# Patient Record
Sex: Male | Born: 1985 | Race: Black or African American | Hispanic: No | Marital: Married | Smoking: Current every day smoker
Health system: Southern US, Community
[De-identification: ages and names within clinical notes are randomized; demographics above are authoritative.]

## PROBLEM LIST (undated history)

## (undated) DIAGNOSIS — F200 Paranoid schizophrenia: Secondary | ICD-10-CM

## (undated) HISTORY — PX: APPENDECTOMY: SHX54

---

## 2019-03-07 ENCOUNTER — Encounter (HOSPITAL_BASED_OUTPATIENT_CLINIC_OR_DEPARTMENT_OTHER): Payer: Self-pay | Admitting: *Deleted

## 2019-03-07 ENCOUNTER — Emergency Department (HOSPITAL_BASED_OUTPATIENT_CLINIC_OR_DEPARTMENT_OTHER)
Admission: EM | Admit: 2019-03-07 | Discharge: 2019-03-07 | Disposition: A | Discharge status: Home / Self Care | Payer: Medicaid Other | Attending: Emergency Medicine | Admitting: Emergency Medicine

## 2019-03-07 ENCOUNTER — Other Ambulatory Visit: Payer: Self-pay

## 2019-03-07 ENCOUNTER — Emergency Department (HOSPITAL_BASED_OUTPATIENT_CLINIC_OR_DEPARTMENT_OTHER): Payer: Medicaid Other

## 2019-03-07 DIAGNOSIS — W298XXA Contact with other powered powered hand tools and household machinery, initial encounter: Secondary | ICD-10-CM | POA: Insufficient documentation

## 2019-03-07 DIAGNOSIS — S60921A Unspecified superficial injury of right hand, initial encounter: Secondary | ICD-10-CM | POA: Diagnosis present

## 2019-03-07 DIAGNOSIS — Y929 Unspecified place or not applicable: Secondary | ICD-10-CM | POA: Insufficient documentation

## 2019-03-07 DIAGNOSIS — F17210 Nicotine dependence, cigarettes, uncomplicated: Secondary | ICD-10-CM | POA: Insufficient documentation

## 2019-03-07 DIAGNOSIS — Y939 Activity, unspecified: Secondary | ICD-10-CM | POA: Diagnosis not present

## 2019-03-07 DIAGNOSIS — Y999 Unspecified external cause status: Secondary | ICD-10-CM | POA: Diagnosis not present

## 2019-03-07 DIAGNOSIS — S61431A Puncture wound without foreign body of right hand, initial encounter: Secondary | ICD-10-CM | POA: Insufficient documentation

## 2019-03-07 MED ORDER — CEPHALEXIN 500 MG PO CAPS: 500.0000 mg | ORAL_CAPSULE | Freq: Three times a day (TID) | ORAL | 0 refills | Status: DC

## 2019-03-07 NOTE — ED Triage Notes (Signed)
c/o lac to right hand lac by metal x 2 days ago

## 2019-03-07 NOTE — ED Provider Notes (Signed)
[facility] EMERGENCY DEPARTMENT Provider Note   CSN: [id number] Arrival date & time: 03/07/19  1524    History   Chief Complaint Chief Complaint  Patient presents with  . Hand Injury    HPI Joe Barrett is a 33 y.o. male.     Patient presents the emergency department with complaint of right hand wound sustained 2 days ago.  Patient was working with a drill bit used to drill wood.  This impacted his right palm towards the mid palm on the ulnar aspect.  Since that time he has had continued pain that is worse with movement.  He is able to flex his fingers.  No redness.  He reports mild swelling.  No difficulty with movement of the wrist or elbow.  No numbness or tingling.  Tetanus up-to-date in the past 2 years.  He is right-handed.     History reviewed. No pertinent past medical history.  There are no active problems to display for this patient.   Past Surgical History:  Procedure Laterality Date  . APPENDECTOMY          Home Medications    Prior to Admission medications   Not on File    Family History History reviewed. No pertinent family history.  Social History Social History   Tobacco Use  . Smoking status: Current Every Day Smoker    Packs/day: 0.50    Types: Cigarettes  . Smokeless tobacco: Never Used  Substance Use Topics  . Alcohol use: Yes    Comment: occ  . Drug use: Not Currently     Allergies   Patient has no known allergies.   Review of Systems Review of Systems  Constitutional: Negative for activity change.  Musculoskeletal: Positive for arthralgias. Negative for neck pain.  Skin: Positive for wound.  Neurological: Negative for weakness and numbness.     Physical Exam Updated Vital Signs BP 111/79   Pulse 90   Temp 98.4 F (36.9 C)   Resp 16   Ht 5\' 3"  (1.6 m)   Wt 97.5 kg   SpO2 99%   BMI 38.09 kg/m   Physical Exam Vitals signs and nursing note reviewed.  Constitutional:      Appearance: He is  well-developed.  HENT:     Head: Normocephalic and atraumatic.  Eyes:     Conjunctiva/sclera: Conjunctivae normal.  Neck:     Musculoskeletal: Normal range of motion and neck supple.  Cardiovascular:     Pulses: Normal pulses. No decreased pulses.  Musculoskeletal:        General: Tenderness present.     Comments: Patient with full active flexion and extension of his right fingers including the ring and short fingers.  There is a healing puncture noted to the mid palm between where the flexor tendons run to the ring and short digits.  No active swelling.  No pain with passive extension. No Kanavel signs.  Capillary refills less than 2 seconds in all fingers.  Skin:    General: Skin is warm and dry.  Neurological:     Mental Status: He is alert.     Sensory: No sensory deficit.     Comments: Motor, sensation, and vascular distal to the injury is fully intact.       ED Treatments / Results  Labs (all labs ordered are listed, but only abnormal results are displayed) Labs Reviewed - No data to display  EKG None  Radiology Dg Hand Complete Right  Result Date: 03/07/2019  CLINICAL DATA:  Pain following puncture wound EXAM: RIGHT HAND - COMPLETE 3+ VIEW COMPARISON:  None. FINDINGS: Frontal, oblique, and lateral views obtained. No fracture or dislocation. Joint spaces appear normal. No erosive change. No soft tissue air or radiopaque foreign body evident. IMPRESSION: No fracture or dislocation. No evident arthropathy. No radiopaque foreign body or soft tissue air evident. Electronically Signed   By: Bretta BangWilliam  Woodruff III M.D.   On: 03/07/2019 16:18    Procedures Procedures (including critical care time)  Medications Ordered in ED Medications - No data to display   Initial Impression / Assessment and Plan / ED Course  I have reviewed the triage vital signs and the nursing notes.  Pertinent labs & imaging results that were available during my care of the patient were reviewed by me  and considered in my medical decision making (see chart for details).        Patient seen and examined.  X-ray ordered to evaluate for bony injury or signs of foreign body.  Wound is currently closed.  No signs of active cellulitis or flexor tenosynovitis, however given nature of wound I feel it would be prudent to start patient on prophylactic antibiotics.  Tetanus is reportedly up-to-date.  X-ray pending.  Vital signs reviewed and are as follows: BP 111/79   Pulse 90   Temp 98.4 F (36.9 C)   Resp 16   Ht 5\' 3"  (1.6 m)   Wt 97.5 kg   SpO2 99%   BMI 38.09 kg/m   No concerning findings on x-ray.  Patient counseled on wound care and signs and symptoms to return including signs of flexor tenosynovitis.  Keflex x5 days prescribed.  Patient verbalizes understanding agrees with plan.  Final Clinical Impressions(s) / ED Diagnoses   Final diagnoses:  Puncture wound of right hand without foreign body, initial encounter   Puncture wound to hand.  No evidence of flexor tenosynovitis or flexor tendon disruption on exam today.  Imaging is reassuring.  Tetanus is up-to-date.  No indication for further work-up at this time.  Patient counseled on signs and symptoms to return as above.  ED Discharge Orders         Ordered    cephALEXin (KEFLEX) 500 MG capsule  3 times daily     03/07/19 1629           Renne CriglerGeiple, Darrik Richman, PA-C 03/07/19 1645    Vanetta MuldersZackowski, Scott, MD 03/09/19 [phone]

## 2019-03-07 NOTE — ED Notes (Signed)
Pt verbalized understanding of dc instructions.

## 2019-03-07 NOTE — Discharge Instructions (Signed)
Please read and follow all provided instructions.  Your diagnoses today include:  1. Puncture wound of right hand without foreign body, initial encounter     Tests performed today include:  An x-ray of the affected area - does NOT show any broken bones  Vital signs. See below for your results today.   Medications prescribed:   Keflex (cephalexin) - antibiotic  You have been prescribed an antibiotic medicine: take the entire course of medicine even if you are feeling better. Stopping early can cause the antibiotic not to work.  Take any prescribed medications only as directed.  Home care instructions:   Follow any educational materials contained in this packet  Follow R.I.C.E. Protocol:  R - rest your injury   I  - use ice on injury without applying directly to skin  C - compress injury with bandage or splint  E - elevate the injury as much as possible  Follow-up instructions: Please follow-up with your primary care provider if you continue to have significant pain in 1 week. In this case you may have a more severe injury that requires further care.   Return instructions:   Please return if your fingers are numb or tingling, appear gray or blue, or you have severe pain (also elevate the arm and loosen splint or wrap if you were given one)  Return if you have severe pain if you try to straighten your finger out or worsening redness and swelling around the puncture wound  Please return to the Emergency Department if you experience worsening symptoms.   Please return if you have any other emergent concerns.  Additional Information:  Your vital signs today were: BP 111/79    Pulse 90    Temp 98.4 F (36.9 C)    Resp 16    Ht 5\' 3"  (1.6 m)    Wt 97.5 kg    SpO2 99%    BMI 38.09 kg/m  If your blood pressure (BP) was elevated above 135/85 this visit, please have this repeated by your doctor within one month. --------------

## 2019-03-11 ENCOUNTER — Other Ambulatory Visit: Payer: Self-pay

## 2019-03-11 ENCOUNTER — Encounter (HOSPITAL_BASED_OUTPATIENT_CLINIC_OR_DEPARTMENT_OTHER): Payer: Self-pay | Admitting: Adult Health

## 2019-03-11 ENCOUNTER — Emergency Department (HOSPITAL_BASED_OUTPATIENT_CLINIC_OR_DEPARTMENT_OTHER)
Admission: EM | Admit: 2019-03-11 | Discharge: 2019-03-11 | Disposition: A | Discharge status: Home / Self Care | Payer: Medicaid Other | Attending: Emergency Medicine | Admitting: Emergency Medicine

## 2019-03-11 DIAGNOSIS — R112 Nausea with vomiting, unspecified: Secondary | ICD-10-CM | POA: Insufficient documentation

## 2019-03-11 DIAGNOSIS — R1111 Vomiting without nausea: Secondary | ICD-10-CM

## 2019-03-11 DIAGNOSIS — F17210 Nicotine dependence, cigarettes, uncomplicated: Secondary | ICD-10-CM | POA: Insufficient documentation

## 2019-03-11 DIAGNOSIS — R1110 Vomiting, unspecified: Secondary | ICD-10-CM | POA: Diagnosis present

## 2019-03-11 MED ORDER — ONDANSETRON 4 MG PO TBDP
4.0000 mg | ORAL_TABLET | Freq: Once | ORAL | Status: AC
  Administered 2019-03-11: 4 mg via ORAL
  Filled 2019-03-11: qty 1

## 2019-03-11 NOTE — ED Triage Notes (Signed)
N/V for past 24 hours, he denies pain, denies fevers. LAst emesis this AM. He is sipping tea.

## 2019-03-11 NOTE — ED Notes (Signed)
Pt sipping tea, denies nausea at this time

## 2019-03-11 NOTE — ED Provider Notes (Signed)
[facility] EMERGENCY DEPARTMENT Provider Note   CSN: [id number] Arrival date & time: 03/11/19  1249    History   Chief Complaint Chief Complaint  Patient presents with  . Emesis    HPI Joe Barrett is a 33 y.o. male.     33 year old male with history of schizophrenia who presents with vomiting.  Patient states that he began having vomiting last night, last episode of vomiting was this morning.  He reports some mild constipation but denies any diarrhea.  No associated fever, abdominal pain, cough, sore throat, urinary symptoms, sick contacts, or recent travel.  No therapies tried for his symptoms.  No recent medication changes.  The history is provided by the patient.  Emesis    History reviewed. No pertinent past medical history.  There are no active problems to display for this patient.   Past Surgical History:  Procedure Laterality Date  . APPENDECTOMY          Home Medications    Prior to Admission medications   Medication Sig Start Date End Date Taking? Authorizing Provider  cephALEXin (KEFLEX) 500 MG capsule Take 1 capsule (500 mg total) by mouth 3 (three) times daily. 03/07/19   Renne CriglerGeiple, Joshua, PA-C    Family History History reviewed. No pertinent family history.  Social History Social History   Tobacco Use  . Smoking status: Current Every Day Smoker    Packs/day: 0.50    Types: Cigarettes  . Smokeless tobacco: Never Used  Substance Use Topics  . Alcohol use: Yes    Comment: occ  . Drug use: Not Currently     Allergies   Patient has no known allergies.   Review of Systems Review of Systems  Gastrointestinal: Positive for vomiting.   All other systems reviewed and are negative except that which was mentioned in HPI   Physical Exam Updated Vital Signs BP 131/85 (BP Location: Right Arm)   Pulse 81   Temp 98.1 F (36.7 C) (Oral)   Resp 14   Ht 5\' 3"  (1.6 m)   Wt 98 kg   SpO2 100%   BMI 38.26 kg/m   Physical  Exam Vitals signs and nursing note reviewed.  Constitutional:      General: He is not in acute distress.    Appearance: He is well-developed.  HENT:     Head: Normocephalic and atraumatic.     Mouth/Throat:     Mouth: Mucous membranes are moist.     Pharynx: Oropharynx is clear.  Eyes:     Conjunctiva/sclera: Conjunctivae normal.  Neck:     Musculoskeletal: Neck supple.  Cardiovascular:     Rate and Rhythm: Normal rate and regular rhythm.     Pulses: Normal pulses.  Pulmonary:     Effort: Pulmonary effort is normal.  Abdominal:     General: There is no distension.     Palpations: Abdomen is soft.     Tenderness: There is no abdominal tenderness.  Musculoskeletal:     Right lower leg: No edema.     Left lower leg: No edema.  Skin:    General: Skin is warm and dry.  Neurological:     Mental Status: He is alert and oriented to person, place, and time.     Comments: Fluent speech  Psychiatric:        Judgment: Judgment normal.      ED Treatments / Results  Labs (all labs ordered are listed, but only abnormal results are displayed) Labs  Reviewed - No data to display  EKG None  Radiology No results found.  Procedures Procedures (including critical care time)  Medications Ordered in ED Medications  ondansetron (ZOFRAN-ODT) disintegrating tablet 4 mg (4 mg Oral Given 03/11/19 1315)     Initial Impression / Assessment and Plan / ED Course  I have reviewed the triage vital signs and the nursing notes.         Comfortable, normal VS, abd soft and non-tender. Denying complaints. Only request is for work excuse. Given vomiting has stopped and he has no complaints, I do not feel he needs labwork or imaging currently. Tolerating PO in the ED.   Discussed supportive measures and reviewed return precautions.  Final Clinical Impressions(s) / ED Diagnoses   Final diagnoses:  Non-intractable vomiting without nausea, unspecified vomiting type    ED Discharge  Orders    None       Little, Ambrose Finland, MD 03/11/19 1343

## 2019-03-11 NOTE — ED Notes (Signed)
Pt states he just had bowel movement after being constipated for several day

## 2019-03-13 ENCOUNTER — Emergency Department (HOSPITAL_BASED_OUTPATIENT_CLINIC_OR_DEPARTMENT_OTHER)
Admission: EM | Admit: 2019-03-13 | Discharge: 2019-03-13 | Disposition: A | Discharge status: Home / Self Care | Payer: Medicaid Other | Attending: Emergency Medicine | Admitting: Emergency Medicine

## 2019-03-13 ENCOUNTER — Other Ambulatory Visit: Payer: Self-pay

## 2019-03-13 ENCOUNTER — Encounter (HOSPITAL_BASED_OUTPATIENT_CLINIC_OR_DEPARTMENT_OTHER): Payer: Self-pay | Admitting: *Deleted

## 2019-03-13 DIAGNOSIS — R197 Diarrhea, unspecified: Secondary | ICD-10-CM | POA: Insufficient documentation

## 2019-03-13 DIAGNOSIS — F17210 Nicotine dependence, cigarettes, uncomplicated: Secondary | ICD-10-CM | POA: Diagnosis not present

## 2019-03-13 NOTE — ED Triage Notes (Signed)
States he had diarrhea x 1 this am while at work and he went home. He has felt better through out the day. States he has an urge to have a BM but is unable to go.

## 2019-03-13 NOTE — ED Provider Notes (Signed)
[facility] EMERGENCY DEPARTMENT Provider Note   CSN: [id number] Arrival date & time: 03/13/19  2104    History   Chief Complaint Chief Complaint  Patient presents with  . Abdominal Pain    HPI Joe Barrett is a 33 y.o. male.     HPI  33 year old male presents with diarrhea.  He states that earlier this morning at work he had an accident on himself from a large amount of diarrhea.  He states that he left work and came home to take a nap.  Later he had 1 more episode of diarrhea. Felt nauseated temporarily but this is gone.  No abdominal pain, back pain, fever.  No blood in his stools.  Does not feel dizzy or lightheaded.  Is asking for a note for missing today.  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 No new medications. No recent antibiotics.  History reviewed. No pertinent past medical history.  There are no active problems to display for this patient.   Past Surgical History:  Procedure Laterality Date  . APPENDECTOMY          Home Medications    Prior to Admission medications   Medication Sig Start Date End Date Taking? Authorizing Provider  cephALEXin (KEFLEX) 500 MG capsule Take 1 capsule (500 mg total) by mouth 3 (three) times daily. 03/07/19  Yes Renne CriglerGeiple, Joshua, PA-C    Family History No family history on file.  Social History Social History   Tobacco Use  . Smoking status: Current Every Day Smoker    Packs/day: 0.50    Types: Cigarettes  . Smokeless tobacco: Never Used  Substance Use Topics  . Alcohol use: Yes    Comment: occ  . Drug use: Not Currently     Allergies   Patient has no known allergies.   Review of Systems Review of Systems  Constitutional: Negative for fever.  Gastrointestinal: Positive for diarrhea and nausea. Negative for abdominal pain, blood in stool and vomiting.  Neurological: Negative for light-headedness.  All other systems reviewed and are negative.    Physical Exam Updated Vital  Signs BP 120/74   Pulse 83   Temp 98.2 F (36.8 C) (Oral)   Resp 14   Ht 5\' 3"  (1.6 m)   Wt 97.9 kg   SpO2 98%   BMI 38.23 kg/m   Physical Exam Vitals signs and nursing note reviewed.  Constitutional:      General: He is not in acute distress.    Appearance: He is well-developed. He is not ill-appearing or diaphoretic.  HENT:     Head: Normocephalic and atraumatic.     Right Ear: External ear normal.     Left Ear: External ear normal.     Nose: Nose normal.     Mouth/Throat:     Mouth: Mucous membranes are moist.  Eyes:     General:        Right eye: No discharge.        Left eye: No discharge.  Neck:     Musculoskeletal: Neck supple.  Cardiovascular:     Rate and Rhythm: Normal rate and regular rhythm.     Heart sounds: Normal heart sounds.  Pulmonary:     Effort: Pulmonary effort is normal.     Breath sounds: Normal breath sounds.  Abdominal:     Palpations: Abdomen is soft.     Tenderness: There is no abdominal tenderness.  Skin:    General: Skin is warm  and dry.  Neurological:     Mental Status: He is alert.  Psychiatric:        Mood and Affect: Mood is not anxious.      ED Treatments / Results  Labs (all labs ordered are listed, but only abnormal results are displayed) Labs Reviewed - No data to display  EKG None  Radiology No results found.  Procedures Procedures (including critical care time)  Medications Ordered in ED Medications - No data to display   Initial Impression / Assessment and Plan / ED Course  I have reviewed the triage vital signs and the nursing notes.  Pertinent labs & imaging results that were available during my care of the patient were reviewed by me and considered in my medical decision making (see chart for details).        Patient appears well.  His vital signs are normal.  With no abdominal tenderness or signs of significant dehydration, he does not appear to need IV fluids, lab work, or emergent imaging.  Will  discharge home with return precautions.  Final Clinical Impressions(s) / ED Diagnoses   Final diagnoses:  Diarrhea, unspecified type    ED Discharge Orders    None       Pricilla Loveless, MD 03/13/19 2127

## 2019-03-13 NOTE — Discharge Instructions (Signed)
If you develop abdominal pain, uncontrolled vomiting, fever, chest or back pain, blood in your stool, or any other new/concerning symptoms then return to the ER for evaluation.

## 2019-03-13 NOTE — ED Notes (Signed)
Encouraged Pt to follow up with [facility]

## 2019-03-17 ENCOUNTER — Encounter (HOSPITAL_BASED_OUTPATIENT_CLINIC_OR_DEPARTMENT_OTHER): Payer: Self-pay

## 2019-03-17 ENCOUNTER — Emergency Department (HOSPITAL_BASED_OUTPATIENT_CLINIC_OR_DEPARTMENT_OTHER)
Admission: EM | Admit: 2019-03-17 | Discharge: 2019-03-17 | Disposition: A | Discharge status: Home / Self Care | Payer: Medicaid Other | Attending: Emergency Medicine | Admitting: Emergency Medicine

## 2019-03-17 ENCOUNTER — Other Ambulatory Visit: Payer: Self-pay

## 2019-03-17 DIAGNOSIS — R197 Diarrhea, unspecified: Secondary | ICD-10-CM

## 2019-03-17 DIAGNOSIS — F17210 Nicotine dependence, cigarettes, uncomplicated: Secondary | ICD-10-CM | POA: Diagnosis not present

## 2019-03-17 DIAGNOSIS — Z79899 Other long term (current) drug therapy: Secondary | ICD-10-CM | POA: Insufficient documentation

## 2019-03-17 NOTE — ED Provider Notes (Signed)
[facility] EMERGENCY DEPARTMENT Provider Note   CSN: [id number] Arrival date & time: 03/17/19  [id number]    History   Chief Complaint Chief Complaint  Patient presents with  . Diarrhea    HPI Joe Barrett is a 33 y.o. male.     33 year old male with history of schizophrenia who presents with diarrhea.  Patient was seen here on 4/28 for a single episode of vomiting that resolved.  He then returned on 4/30 with an episode of diarrhea.  He states that his diarrhea resolved after that and he had some formed stools but this morning he had another episode of nonbloody diarrhea.  He denies any associated abdominal pain, nausea, vomiting, fevers, cough, or URI symptoms.  He ate fried chicken yesterday and reports normal eating and drinking.  Has not eaten breakfast yet this morning.  No urinary symptoms.  No recent changes to medications or new medications.  No sick contacts or recent travel.  The history is provided by the patient.  Diarrhea    History reviewed. No pertinent past medical history.  There are no active problems to display for this patient.   Past Surgical History:  Procedure Laterality Date  . APPENDECTOMY          Home Medications    Prior to Admission medications   Medication Sig Start Date End Date Taking? Authorizing Provider  cephALEXin (KEFLEX) 500 MG capsule Take 1 capsule (500 mg total) by mouth 3 (three) times daily. 03/07/19   Renne CriglerGeiple, Joshua, PA-C    Family History History reviewed. No pertinent family history.  Social History Social History   Tobacco Use  . Smoking status: Current Every Day Smoker    Packs/day: 0.50    Types: Cigarettes  . Smokeless tobacco: Never Used  Substance Use Topics  . Alcohol use: Yes    Comment: occ  . Drug use: Not Currently     Allergies   Patient has no known allergies.   Review of Systems Review of Systems  Gastrointestinal: Positive for diarrhea.   All other systems reviewed and are  negative except that which was mentioned in HPI   Physical Exam Updated Vital Signs BP 134/76 (BP Location: Right Arm)   Pulse 78   Temp 98.4 F (36.9 C) (Oral)   Resp 16   Ht 5\' 3"  (1.6 m)   Wt 97.5 kg   SpO2 98%   BMI 38.09 kg/m   Physical Exam Vitals signs and nursing note reviewed.  Constitutional:      General: He is not in acute distress.    Appearance: He is well-developed.  HENT:     Head: Normocephalic and atraumatic.  Eyes:     Conjunctiva/sclera: Conjunctivae normal.  Neck:     Musculoskeletal: Neck supple.  Pulmonary:     Effort: Pulmonary effort is normal.  Abdominal:     General: There is no distension.     Palpations: Abdomen is soft.     Tenderness: There is no abdominal tenderness.  Musculoskeletal:     Right lower leg: No edema.     Left lower leg: No edema.  Skin:    General: Skin is warm and dry.  Neurological:     Mental Status: He is alert and oriented to person, place, and time.     Comments: Fluent speech  Psychiatric:     Comments: Flat affect      ED Treatments / Results  Labs (all labs ordered are listed, but only abnormal  results are displayed) Labs Reviewed - No data to display  EKG None  Radiology No results found.  Procedures Procedures (including critical care time)  Medications Ordered in ED Medications - No data to display   Initial Impression / Assessment and Plan / ED Course  I have reviewed the triage vital signs and the nursing notes.         Single episode of non-bloody diarrhea this morning with no associated sx. No abdominal tenderness and no complaints currently therefore I do not feel he needs testing or imaging at this time. Discussed OTC medications to help with symptoms and recommended dietary restrictions including avoiding greasy/fried foods. Return precautions reviewed. Final Clinical Impressions(s) / ED Diagnoses   Final diagnoses:  Diarrhea, unspecified type    ED Discharge Orders     None       Ethyn Schetter, Ambrose Finland, MD 03/17/19 [phone]

## 2019-03-17 NOTE — ED Triage Notes (Signed)
Pt states seen for diarrhea on April 24th, has had some improvement with some formed stool but has still had a  loose stools this morning.   Was given prescription at the time which he did not start.  Reports continuing fatigue, denies nausea/vomiting.  Able to eat and drink normally.

## 2019-03-20 ENCOUNTER — Encounter (HOSPITAL_BASED_OUTPATIENT_CLINIC_OR_DEPARTMENT_OTHER): Payer: Self-pay

## 2019-03-20 ENCOUNTER — Other Ambulatory Visit: Payer: Self-pay

## 2019-03-20 ENCOUNTER — Emergency Department (HOSPITAL_BASED_OUTPATIENT_CLINIC_OR_DEPARTMENT_OTHER)
Admission: EM | Admit: 2019-03-20 | Discharge: 2019-03-20 | Disposition: A | Discharge status: Home / Self Care | Payer: Medicaid Other | Attending: Emergency Medicine | Admitting: Emergency Medicine

## 2019-03-20 DIAGNOSIS — F17210 Nicotine dependence, cigarettes, uncomplicated: Secondary | ICD-10-CM | POA: Diagnosis not present

## 2019-03-20 DIAGNOSIS — R51 Headache: Secondary | ICD-10-CM | POA: Diagnosis present

## 2019-03-20 DIAGNOSIS — Z79899 Other long term (current) drug therapy: Secondary | ICD-10-CM | POA: Insufficient documentation

## 2019-03-20 DIAGNOSIS — R194 Change in bowel habit: Secondary | ICD-10-CM | POA: Insufficient documentation

## 2019-03-20 DIAGNOSIS — R519 Headache, unspecified: Secondary | ICD-10-CM

## 2019-03-20 MED ORDER — ACETAMINOPHEN 500 MG PO TABS
1000.0000 mg | ORAL_TABLET | Freq: Once | ORAL | Status: AC
  Administered 2019-03-20: 21:00:00 1000 mg via ORAL
  Filled 2019-03-20: qty 2

## 2019-03-20 MED ORDER — IBUPROFEN 800 MG PO TABS
800.0000 mg | ORAL_TABLET | Freq: Once | ORAL | Status: AC
  Administered 2019-03-20: 800 mg via ORAL
  Filled 2019-03-20: qty 1

## 2019-03-20 MED ORDER — DEXAMETHASONE 6 MG PO TABS
10.0000 mg | ORAL_TABLET | Freq: Once | ORAL | Status: AC
  Administered 2019-03-20: 10 mg via ORAL
  Filled 2019-03-20: qty 1

## 2019-03-20 MED ORDER — AMOXICILLIN-POT CLAVULANATE 875-125 MG PO TABS: 1.0000 | ORAL_TABLET | Freq: Two times a day (BID) | ORAL | 0 refills | Status: DC

## 2019-03-20 NOTE — ED Provider Notes (Signed)
[facility] EMERGENCY DEPARTMENT Provider Note   CSN: [id number] Arrival date & time: 03/20/19  1853    History   Chief Complaint Chief Complaint  Patient presents with  . Headache    HPI Joe Barrett is a 33 y.o. male.     33 yo M with a chief complaint of a headache.  Going on for the past day.  Seems to fluctuate throughout the day.  No fevers no congestion no ear pain sore throat cough.  Denies sick contacts.  Denies trauma denies neck pain.  Has had some loose stools every time he eats and so has been eating less.  Describes the headache is somewhere inside of his head.  Smoking cigarettes makes this worse.   The history is provided by the patient.  Headache  Pain location:  Generalized Quality:  Dull Radiates to:  Does not radiate Severity currently:  8/10 Severity at highest:  8/10 Onset quality:  Sudden Duration:  2 days Timing:  Constant Progression:  Worsening Chronicity:  New Similar to prior headaches: no   Relieved by:  Nothing Worsened by:  Nothing Ineffective treatments:  None tried Associated symptoms: no abdominal pain, no congestion, no diarrhea, no fever, no myalgias and no vomiting     History reviewed. No pertinent past medical history.  There are no active problems to display for this patient.   Past Surgical History:  Procedure Laterality Date  . APPENDECTOMY          Home Medications    Prior to Admission medications   Medication Sig Start Date End Date Taking? Authorizing Provider  amoxicillin-clavulanate (AUGMENTIN) 875-125 MG tablet Take 1 tablet by mouth every 12 (twelve) hours. 03/20/19   Melene PlanFloyd, Amador Braddy, DO  cephALEXin (KEFLEX) 500 MG capsule Take 1 capsule (500 mg total) by mouth 3 (three) times daily. 03/07/19   Renne CriglerGeiple, Joshua, PA-C    Family History No family history on file.  Social History Social History   Tobacco Use  . Smoking status: Current Every Day Smoker    Packs/day: 0.50    Types: Cigarettes   . Smokeless tobacco: Never Used  Substance Use Topics  . Alcohol use: Yes    Comment: occ  . Drug use: Never     Allergies   Patient has no known allergies.   Review of Systems Review of Systems  Constitutional: Negative for chills and fever.  HENT: Negative for congestion and facial swelling.   Eyes: Negative for discharge and visual disturbance.  Respiratory: Negative for shortness of breath.   Cardiovascular: Negative for chest pain and palpitations.  Gastrointestinal: Negative for abdominal pain, diarrhea and vomiting.  Musculoskeletal: Negative for arthralgias and myalgias.  Skin: Negative for color change and rash.  Neurological: Positive for headaches. Negative for tremors and syncope.  Psychiatric/Behavioral: Negative for confusion and dysphoric mood.     Physical Exam Updated Vital Signs BP 117/83 (BP Location: Right Arm)   Pulse 95   Temp 98.2 F (36.8 C) (Oral)   Resp 14   Ht 5\' 3"  (1.6 m)   Wt 97.5 kg   SpO2 96%   BMI 38.09 kg/m   Physical Exam Vitals signs and nursing note reviewed.  Constitutional:      Appearance: He is well-developed.  HENT:     Head: Normocephalic and atraumatic.     Comments: Swollen turbinates, posterior nasal drip, no noted sinus ttp, effusion to bilateral TMs without bulging or erythema Eyes:     Pupils: Pupils are  equal, round, and reactive to light.  Neck:     Musculoskeletal: Normal range of motion and neck supple.     Vascular: No JVD.  Cardiovascular:     Rate and Rhythm: Normal rate and regular rhythm.     Heart sounds: No murmur. No friction rub. No gallop.   Pulmonary:     Effort: No respiratory distress.     Breath sounds: No wheezing.  Abdominal:     General: There is no distension.     Tenderness: There is no guarding or rebound.  Musculoskeletal: Normal range of motion.  Skin:    Coloration: Skin is not pale.     Findings: No rash.  Neurological:     Mental Status: He is alert and oriented to person,  place, and time.     GCS: GCS eye subscore is 4. GCS verbal subscore is 5. GCS motor subscore is 6.     Cranial Nerves: Cranial nerves are intact.     Sensory: Sensation is intact.     Motor: Motor function is intact.     Coordination: Coordination is intact.     Gait: Gait is intact.     Comments: Benign neuro exam  Psychiatric:        Behavior: Behavior normal.      ED Treatments / Results  Labs (all labs ordered are listed, but only abnormal results are displayed) Labs Reviewed - No data to display  EKG None  Radiology No results found.  Procedures Procedures (including critical care time)  Medications Ordered in ED Medications  acetaminophen (TYLENOL) tablet 1,000 mg (has no administration in time range)  ibuprofen (ADVIL) tablet 800 mg (has no administration in time range)  dexamethasone (DECADRON) tablet 10 mg (has no administration in time range)     Initial Impression / Assessment and Plan / ED Course  I have reviewed the triage vital signs and the nursing notes.  Pertinent labs & imaging results that were available during my care of the patient were reviewed by me and considered in my medical decision making (see chart for details).        33 yo M with a chief complaint of a headache.  Described as somewhere in his head and worse with smoking.  He has a benign neurologic exam.  No red flags.  Do not feel imaging would be beneficial at this time.  He drove here and so I am unable to give him a headache cocktail.  Will give Tylenol ibuprofen and a dose of Decadron.  Given neurology follow-up.  He does have bilateral effusions to the TMs I will treat for possible sinusitis.  9:08 PM:  I have discussed the diagnosis/risks/treatment options with the patient and believe the pt to be eligible for discharge home to follow-up with PCP. We also discussed returning to the ED immediately if new or worsening sx occur. We discussed the sx which are most concerning (e.g.,  sudden worsening pain, fever, inability to tolerate by mouth) that necessitate immediate return. Medications administered to the patient during their visit and any new prescriptions provided to the patient are listed below.  Medications given during this visit Medications  acetaminophen (TYLENOL) tablet 1,000 mg (has no administration in time range)  ibuprofen (ADVIL) tablet 800 mg (has no administration in time range)  dexamethasone (DECADRON) tablet 10 mg (has no administration in time range)     The patient appears reasonably screen and/or stabilized for discharge and I doubt any other medical  condition or other [facility] requiring further screening, evaluation, or treatment in the ED at this time prior to discharge.    Final Clinical Impressions(s) / ED Diagnoses   Final diagnoses:  Bad headache    ED Discharge Orders         Ordered    amoxicillin-clavulanate (AUGMENTIN) 875-125 MG tablet  Every 12 hours     03/20/19 2058    Ambulatory referral to Neurology     03/20/19 2059           Melene Plan, DO 03/20/19 2108

## 2019-03-20 NOTE — ED Notes (Signed)
ED Provider at bedside. 

## 2019-03-20 NOTE — ED Triage Notes (Signed)
C/o HA x today- pain site "feels like it's inside"-denies injury-NAD-steady gait

## 2019-03-20 NOTE — Discharge Instructions (Signed)
You have some fluid on your ears.  Your headache may be due to an infection.  We will start you on antibiotics.  Please return for worsening pain fever or weakness on one side or the other for difficulty with speech or swallowing.  Follow-up with a neurologist in the office.

## 2019-06-26 ENCOUNTER — Emergency Department (HOSPITAL_BASED_OUTPATIENT_CLINIC_OR_DEPARTMENT_OTHER)
Admission: EM | Admit: 2019-06-26 | Discharge: 2019-06-26 | Disposition: A | Discharge status: Home / Self Care | Payer: Medicaid Other | Attending: Emergency Medicine | Admitting: Emergency Medicine

## 2019-06-26 ENCOUNTER — Encounter (HOSPITAL_BASED_OUTPATIENT_CLINIC_OR_DEPARTMENT_OTHER): Payer: Self-pay

## 2019-06-26 ENCOUNTER — Other Ambulatory Visit: Payer: Self-pay

## 2019-06-26 DIAGNOSIS — S63501A Unspecified sprain of right wrist, initial encounter: Secondary | ICD-10-CM | POA: Insufficient documentation

## 2019-06-26 DIAGNOSIS — Y999 Unspecified external cause status: Secondary | ICD-10-CM | POA: Insufficient documentation

## 2019-06-26 DIAGNOSIS — Y929 Unspecified place or not applicable: Secondary | ICD-10-CM | POA: Insufficient documentation

## 2019-06-26 DIAGNOSIS — Y9389 Activity, other specified: Secondary | ICD-10-CM | POA: Insufficient documentation

## 2019-06-26 DIAGNOSIS — F17210 Nicotine dependence, cigarettes, uncomplicated: Secondary | ICD-10-CM | POA: Insufficient documentation

## 2019-06-26 DIAGNOSIS — Z79899 Other long term (current) drug therapy: Secondary | ICD-10-CM | POA: Diagnosis not present

## 2019-06-26 DIAGNOSIS — X500XXA Overexertion from strenuous movement or load, initial encounter: Secondary | ICD-10-CM | POA: Insufficient documentation

## 2019-06-26 DIAGNOSIS — S6991XA Unspecified injury of right wrist, hand and finger(s), initial encounter: Secondary | ICD-10-CM | POA: Diagnosis present

## 2019-06-26 NOTE — ED Triage Notes (Signed)
Pt states injured right hand two weeks ago, increased pain since then.  Has not taken anything for pain.  Moves all fingers without difficulty, but reports painful to do so.

## 2019-06-26 NOTE — ED Provider Notes (Signed)
[facility] EMERGENCY DEPARTMENT Provider Note   CSN: [id number] Arrival date & time: 06/26/19  1053     History   Chief Complaint Chief Complaint  Patient presents with  . Hand Injury    HPI Joe Barrett is a 33 y.o. male.     The history is provided by the patient.  Hand Injury Location:  Wrist Injury: yes   Time since incident:  2 weeks Mechanism of injury comment:  Lifting and moving furniture and his right hand got hyperflexed Pain details:    Quality:  Aching and throbbing   Radiates to: radiates from the wrist up into the palmar and dorsal hand.   Severity:  Moderate   Onset quality:  Gradual   Duration:  2 weeks   Timing:  Intermittent   Progression:  Waxing and waning Handedness:  Right-handed Dislocation: no   Foreign body present:  No foreign bodies Prior injury to area:  No Relieved by:  Rest Exacerbated by: lifting or stretching the wrist. Ineffective treatments:  None tried Associated symptoms: stiffness   Associated symptoms: no muscle weakness, no numbness, no swelling and no tingling   Risk factors comment:  Healthy   History reviewed. No pertinent past medical history.  There are no active problems to display for this patient.   Past Surgical History:  Procedure Laterality Date  . APPENDECTOMY          Home Medications    Prior to Admission medications   Medication Sig Start Date End Date Taking? Authorizing Provider  amoxicillin-clavulanate (AUGMENTIN) 875-125 MG tablet Take 1 tablet by mouth every 12 (twelve) hours. 03/20/19   Deno Etienne, DO  cephALEXin (KEFLEX) 500 MG capsule Take 1 capsule (500 mg total) by mouth 3 (three) times daily. 03/07/19   Carlisle Cater, PA-C    Family History History reviewed. No pertinent family history.  Social History Social History   Tobacco Use  . Smoking status: Current Every Day Smoker    Packs/day: 0.50    Types: Cigarettes  . Smokeless tobacco: Never Used  Substance  Use Topics  . Alcohol use: Yes    Comment: occ  . Drug use: Never     Allergies   Patient has no known allergies.   Review of Systems Review of Systems  Musculoskeletal: Positive for stiffness.  All other systems reviewed and are negative.    Physical Exam Updated Vital Signs BP 129/79 (BP Location: Right Arm)   Pulse 68   Temp 98.2 F (36.8 C) (Oral)   Resp 16   Ht 5\' 4"  (1.626 m)   Wt 93 kg   SpO2 100%   BMI 35.19 kg/m   Physical Exam Vitals signs and nursing note reviewed.  Constitutional:      General: He is not in acute distress.    Appearance: He is normal weight.  HENT:     Head: Normocephalic.  Cardiovascular:     Rate and Rhythm: Normal rate.  Pulmonary:     Effort: Pulmonary effort is normal.  Musculoskeletal:     Right wrist: He exhibits tenderness. He exhibits normal range of motion and no bony tenderness.     Comments: Mild pain with flexion of the wrist.  No snuffbox tenderness or hand swelling present.  Normal finger opposition and sensation.  Full ROM at the DIP/PIP/MCP joint.  No tenderness with ulnar or radial deviation of the wrist  Skin:    General: Skin is warm and dry.  Neurological:  General: No focal deficit present.     Mental Status: He is alert and oriented to person, place, and time. Mental status is at baseline.  Psychiatric:        Mood and Affect: Mood normal.        Behavior: Behavior normal.        Thought Content: Thought content normal.      ED Treatments / Results  Labs (all labs ordered are listed, but only abnormal results are displayed) Labs Reviewed - No data to display  EKG None  Radiology No results found.  Procedures Procedures (including critical care time)  Medications Ordered in ED Medications - No data to display   Initial Impression / Assessment and Plan / ED Course  I have reviewed the triage vital signs and the nursing notes.  Pertinent labs & imaging results that were available during  my care of the patient were reviewed by me and considered in my medical decision making (see chart for details).       Pt presenting with sx most consistent with wrist sprain.  Pt has not tried anything for the pain.  No signs of fracture or nerve involvement on exam.  Will try supportive care with splinting and NSAIDs as needed.  Pt given f/u.  Final Clinical Impressions(s) / ED Diagnoses   Final diagnoses:  Sprain of right wrist, initial encounter    ED Discharge Orders    None       Gwyneth SproutPlunkett, Mayme Profeta, MD 06/26/19 1156

## 2019-06-26 NOTE — Discharge Instructions (Signed)
Wear the brace all the time except to shower.  Use tylenol or ibuprofen as needed for pain.  Avoid lifting.

## 2019-07-26 ENCOUNTER — Other Ambulatory Visit: Payer: Self-pay

## 2019-07-26 ENCOUNTER — Emergency Department (HOSPITAL_BASED_OUTPATIENT_CLINIC_OR_DEPARTMENT_OTHER): Payer: Medicaid Other

## 2019-07-26 ENCOUNTER — Emergency Department (HOSPITAL_BASED_OUTPATIENT_CLINIC_OR_DEPARTMENT_OTHER)
Admission: EM | Admit: 2019-07-26 | Discharge: 2019-07-26 | Disposition: A | Discharge status: Home / Self Care | Payer: Medicaid Other | Attending: Emergency Medicine | Admitting: Emergency Medicine

## 2019-07-26 ENCOUNTER — Encounter (HOSPITAL_BASED_OUTPATIENT_CLINIC_OR_DEPARTMENT_OTHER): Payer: Self-pay

## 2019-07-26 DIAGNOSIS — M25572 Pain in left ankle and joints of left foot: Secondary | ICD-10-CM | POA: Diagnosis present

## 2019-07-26 DIAGNOSIS — F17210 Nicotine dependence, cigarettes, uncomplicated: Secondary | ICD-10-CM | POA: Insufficient documentation

## 2019-07-26 DIAGNOSIS — M79605 Pain in left leg: Secondary | ICD-10-CM

## 2019-07-26 NOTE — ED Triage Notes (Addendum)
Pt c/o L ankle pain without injury. Pt was seen at [facility] ED but LWBS.

## 2019-07-26 NOTE — ED Provider Notes (Signed)
Emergency Department Provider Note   I have reviewed the triage vital signs and the nursing notes.   HISTORY  Chief Complaint Ankle Pain   HPI Joe Barrett is a 33 y.o. male presents to the emergency department for evaluation of left ankle pain starting today.  Patient describes pain while standing at work today.  He recently began working [name] shifts and developed the pain while at work.  He denies any injury or twisting of the ankle.  No knee or foot pain/swelling.  No fevers.  No redness.  Pain is worse with standing.  He presented to an outside emergency department which had very Joe Barrett wait so left and came here.  History reviewed. No pertinent past medical history.  There are no active problems to display for this patient.   Past Surgical History:  Procedure Laterality Date  . APPENDECTOMY      Allergies Patient has no known allergies.  No family history on file.  Social History Social History   Tobacco Use  . Smoking status: Current Every Day Smoker    Packs/day: 0.50    Types: Cigarettes  . Smokeless tobacco: Never Used  Substance Use Topics  . Alcohol use: Yes    Comment: occ  . Drug use: Never    Review of Systems  Constitutional: No fever/chills Musculoskeletal: Negative for back pain. Positive left ankle/leg pain. No swelling.  Skin: Negative for rash. Neurological: Negative for numbness.  10-point ROS otherwise negative.  ____________________________________________   PHYSICAL EXAM:  VITAL SIGNS: ED Triage Vitals  Enc Vitals Group     BP 07/26/19 1620 131/64     Pulse Rate 07/26/19 1620 60     Resp 07/26/19 1620 13     Temp 07/26/19 1620 98.9 F (37.2 C)     Temp Source 07/26/19 1620 Oral     SpO2 07/26/19 1620 98 %     Weight 07/26/19 1617 205 lb (93 kg)     Height 07/26/19 1617 5\' 4"  (1.626 m)   Constitutional: Alert and oriented. Well appearing and in no acute distress. Eyes: Conjunctivae are normal.  Head:  Atraumatic. Nose: No congestion/rhinnorhea. Mouth/Throat: Mucous membranes are moist.  Neck: No stridor.   Cardiovascular: Good peripheral circulation.  Respiratory: Normal respiratory effort.  Gastrointestinal: No distention.  Musculoskeletal: Normal left ankle ROM. No redness or swelling. No bruising. Mild tenderness to palpation over the left anterior, distal tibia.  Neurologic:  Normal speech and language.  Skin:  Skin is warm, dry and intact. No rash noted.  ____________________________________________  RADIOLOGY  Dg Tibia/fibula Left  Result Date: 07/26/2019 CLINICAL DATA:  Acute LEFT LOWER leg pain today. No known injury. Initial encounter. EXAM: LEFT TIBIA AND FIBULA - 2 VIEW COMPARISON:  None. FINDINGS: There is no evidence of fracture or other focal bone lesions. Soft tissues are unremarkable. IMPRESSION: Negative. Electronically Signed   By: Harmon PierJeffrey  Barrett M.D.   On: 07/26/2019 17:15    ____________________________________________   PROCEDURES  Procedure(s) performed:   Procedures  None  ____________________________________________   INITIAL IMPRESSION / ASSESSMENT AND PLAN / ED COURSE  Pertinent labs & imaging results that were available during my care of the patient were reviewed by me and considered in my medical decision making (see chart for details).   Patient presents to the emergency department for evaluation of left ankle pain.  His tenderness is more in the anterior, distal tibia.  There is no overlying cellulitis, leg swelling.  No ankle swelling.  Normal  range of motion.  No evidence of septic arthritis.  Plan for plain films of the tib-fib on the left and reassess.  Plain film with no acute findings. Plan for RICE at home and PCP referral. Provided work note to return tomorrow. Discussed supportive footwear as near-term plan for symptoms.  ____________________________________________  FINAL CLINICAL IMPRESSION(S) / ED DIAGNOSES  Final diagnoses:  Left  leg pain    Note:  This document was prepared using Dragon voice recognition software and may include unintentional dictation errors.  Joe Quinton, MD Emergency Medicine    Joe Barrett, Joe Olds, MD 07/26/19 [phone]

## 2019-07-26 NOTE — Discharge Instructions (Signed)

## 2019-09-02 ENCOUNTER — Encounter (HOSPITAL_BASED_OUTPATIENT_CLINIC_OR_DEPARTMENT_OTHER): Payer: Self-pay | Admitting: *Deleted

## 2019-09-02 ENCOUNTER — Emergency Department (HOSPITAL_BASED_OUTPATIENT_CLINIC_OR_DEPARTMENT_OTHER)
Admission: EM | Admit: 2019-09-02 | Discharge: 2019-09-02 | Disposition: A | Discharge status: Home / Self Care | Payer: Medicaid Other | Attending: Emergency Medicine | Admitting: Emergency Medicine

## 2019-09-02 ENCOUNTER — Other Ambulatory Visit: Payer: Self-pay

## 2019-09-02 DIAGNOSIS — R040 Epistaxis: Secondary | ICD-10-CM

## 2019-09-02 DIAGNOSIS — F17210 Nicotine dependence, cigarettes, uncomplicated: Secondary | ICD-10-CM | POA: Insufficient documentation

## 2019-09-02 HISTORY — DX: Paranoid schizophrenia: F20.0

## 2019-09-02 LAB — CBG MONITORING, ED: Glucose-Capillary: 86 mg/dL (ref 70–99)

## 2019-09-02 NOTE — ED Provider Notes (Signed)
[facility]EMERGENCY DEPT [facility] Emergency Department Provider Note MRN:  [id number]  Arrival date & time: 09/02/19     Chief Complaint   Epistaxis   History of Present Illness   Joe Barrett is a 33 y.o. year-old male with a history of schizophrenia presenting to the ED with chief complaint of epistaxis.  Nosebleed earlier today, explains that he gets nosebleeds all the time, this 1 seemed heavier than normal.  Bleeding has stopped.  Endorsing some runny nose and nasal congestion recently.  Denies fever, no cough, no shortness of breath, no abdominal pain, no chest pain, no trauma.  Review of Systems  A problem-focused ROS was performed. Positive for epistaxis.  Patient denies trauma.  Patient's Health History    Past Medical History:  Diagnosis Date  . Paranoid schizophrenia [facility])     Past Surgical History:  Procedure Laterality Date  . APPENDECTOMY      No family history on file.  Social History   Socioeconomic History  . Marital status: Married    Spouse name: Not on file  . Number of children: Not on file  . Years of education: Not on file  . Highest education level: Not on file  Occupational History  . Not on file  Social Needs  . Financial resource strain: Not on file  . Food insecurity    Worry: Not on file    Inability: Not on file  . Transportation needs    Medical: Not on file    Non-medical: Not on file  Tobacco Use  . Smoking status: Current Every Day Smoker    Packs/day: 0.50    Types: Cigarettes  . Smokeless tobacco: Never Used  Substance and Sexual Activity  . Alcohol use: Yes    Comment: occ  . Drug use: Never  . Sexual activity: Not on file  Lifestyle  . Physical activity    Days per week: Not on file    Minutes per session: Not on file  . Stress: Not on file  Relationships  . Social Musician on phone: Not on file    Gets together: Not on file    Attends religious service: Not on file    Active member  of club or organization: Not on file    Attends meetings of clubs or organizations: Not on file    Relationship status: Not on file  . Intimate partner violence    Fear of current or ex partner: Not on file    Emotionally abused: Not on file    Physically abused: Not on file    Forced sexual activity: Not on file  Other Topics Concern  . Not on file  Social History Narrative  . Not on file     Physical Exam  Vital Signs and Nursing Notes reviewed Vitals:   09/02/19 1109  BP: 110/80  Pulse: 69  Resp: 18  Temp: 98.1 F (36.7 C)  SpO2: 99%    CONSTITUTIONAL: Well-appearing, NAD NEURO:  Alert and oriented x 3, no focal deficits EYES:  eyes equal and reactive ENT/NECK:  no LAD, no JVD; dried blood within left nare, no active bleeding CARDIO: Regular rate, well-perfused, normal S1 and S2 PULM:  CTAB no wheezing or rhonchi GI/GU:  normal bowel sounds, non-distended, non-tender MSK/SPINE:  No gross deformities, no edema SKIN:  no rash, atraumatic PSYCH:  Appropriate speech and behavior  Diagnostic and Interventional Summary    Labs Reviewed  CBG MONITORING, ED  No orders to display    Medications - No data to display   Procedures Critical Care  ED Course and Medical Decision Making  I have reviewed the triage vital signs and the nursing notes.  Pertinent labs & imaging results that were available during my care of the patient were reviewed by me and considered in my medical decision making (see below for details).  Epistaxis, now resolved, appropriate for discharge with return precautions.  Patient with concerns about diabetes, not due to symptoms but due to family history.  Encourage primary care follow-up.  Screening blood sugar here normal.  Barth Kirks. Sedonia Small, [place] [email or web address]  Final Clinical Impressions(s) / ED Diagnoses     ICD-10-CM   1. Epistaxis  R04.0     ED Discharge Orders    None       Discharge Instructions Discussed with and Provided to Patient:   Discharge Instructions     You were evaluated in the Emergency Department and after careful evaluation, we did not find any emergent condition requiring admission or further testing in the hospital.  Your exam/testing today is overall reassuring.  Your bleeding seems to be due to dried skin within the nose.  Please use Vaseline as we discussed.  We recommend follow-up with a primary care doctor to further discuss necessary diabetes testing.  Your blood sugar today was normal.  Please return to the Emergency Department if you experience any worsening of your condition.  We encourage you to follow up with a primary care provider.  Thank you for allowing [place] to be a part of your care.      Maudie Flakes, MD 09/02/19 [phone]

## 2019-09-02 NOTE — Discharge Instructions (Addendum)
You were evaluated in the Emergency Department and after careful evaluation, we did not find any emergent condition requiring admission or further testing in the hospital.  Your exam/testing today is overall reassuring.  Your bleeding seems to be due to dried skin within the nose.  Please use Vaseline as we discussed.  We recommend follow-up with a primary care doctor to further discuss necessary diabetes testing.  Your blood sugar today was normal.  Please return to the Emergency Department if you experience any worsening of your condition.  We encourage you to follow up with a primary care provider.  Thank you for allowing [place] to be a part of your care.

## 2019-09-02 NOTE — ED Triage Notes (Signed)
Nosebleed while at work this am. No bleeding on arrival to triage.

## 2019-09-10 ENCOUNTER — Other Ambulatory Visit: Payer: Self-pay

## 2019-09-10 ENCOUNTER — Emergency Department (HOSPITAL_BASED_OUTPATIENT_CLINIC_OR_DEPARTMENT_OTHER)
Admission: EM | Admit: 2019-09-10 | Discharge: 2019-09-10 | Discharge status: Absent without leave | Payer: Medicaid Other

## 2019-09-30 ENCOUNTER — Emergency Department (HOSPITAL_BASED_OUTPATIENT_CLINIC_OR_DEPARTMENT_OTHER)
Admission: EM | Admit: 2019-09-30 | Discharge: 2019-09-30 | Disposition: A | Discharge status: Home / Self Care | Payer: Medicaid Other | Attending: Emergency Medicine | Admitting: Emergency Medicine

## 2019-09-30 ENCOUNTER — Other Ambulatory Visit: Payer: Self-pay

## 2019-09-30 ENCOUNTER — Encounter (HOSPITAL_BASED_OUTPATIENT_CLINIC_OR_DEPARTMENT_OTHER): Payer: Self-pay | Admitting: *Deleted

## 2019-09-30 ENCOUNTER — Emergency Department (HOSPITAL_BASED_OUTPATIENT_CLINIC_OR_DEPARTMENT_OTHER): Payer: Medicaid Other

## 2019-09-30 DIAGNOSIS — M79641 Pain in right hand: Secondary | ICD-10-CM | POA: Insufficient documentation

## 2019-09-30 DIAGNOSIS — Z79899 Other long term (current) drug therapy: Secondary | ICD-10-CM | POA: Insufficient documentation

## 2019-09-30 DIAGNOSIS — F17210 Nicotine dependence, cigarettes, uncomplicated: Secondary | ICD-10-CM | POA: Insufficient documentation

## 2019-09-30 MED ORDER — ACETAMINOPHEN 500 MG PO TABS: 500.0000 mg | ORAL_TABLET | Freq: Four times a day (QID) | ORAL | 0 refills | Status: DC | PRN

## 2019-09-30 MED ORDER — DICLOFENAC SODIUM 1 % EX GEL: 2.0000 g | Freq: Four times a day (QID) | CUTANEOUS | 0 refills | Status: DC

## 2019-09-30 MED ORDER — IBUPROFEN 400 MG PO TABS
600.0000 mg | ORAL_TABLET | Freq: Once | ORAL | Status: AC
  Administered 2019-09-30: 20:00:00 600 mg via ORAL
  Filled 2019-09-30: qty 1

## 2019-09-30 NOTE — ED Provider Notes (Signed)
[facility] EMERGENCY DEPARTMENT Provider Note   CSN: [id number] Arrival date & time: 09/30/19  1944     History   Chief Complaint Chief Complaint  Patient presents with  . Hand Pain    HPI Joe Barrett is a 33 y.o. 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  Patient reports he was using it more than usual.  He denies any numbness or tingling.  He did not try anything at home for symptoms.  He denies any wrist, forearm, or elbow pain.  He denies any trauma.    HPI  Past Medical History:  Diagnosis Date  . Paranoid schizophrenia (HCC)     There are no active problems to display for this patient.   Past Surgical History:  Procedure Laterality Date  . APPENDECTOMY          Home Medications    Prior to Admission medications   Medication Sig Start Date End Date Taking? Authorizing Provider  acetaminophen (TYLENOL) 500 MG tablet Take 1 tablet (500 mg total) by mouth every 6 (six) hours as needed. 09/30/19   Blakeley Scheier, Waylan Boga, PA-C  amoxicillin-clavulanate (AUGMENTIN) 875-125 MG tablet Take 1 tablet by mouth every 12 (twelve) hours. 03/20/19   Melene Plan, DO  cephALEXin (KEFLEX) 500 MG capsule Take 1 capsule (500 mg total) by mouth 3 (three) times daily. 03/07/19   Renne Crigler, PA-C  diclofenac Sodium (VOLTAREN) 1 % GEL Apply 2 g topically 4 (four) times daily. 09/30/19   Amario Longmore, Waylan Boga, PA-C  fluPHENAZine (PROLIXIN) 2.5 MG tablet TK 1 T PO QAM AND 2 TS QHS. DO NOT DRIVE OR OPERATE MACHINERY. DO NOT USE ALCOHOL 02/08/18   [provider]    Family History History reviewed. No pertinent family history.  Social History Social History   Tobacco Use  . Smoking status: Current Every Day Smoker    Packs/day: 0.50    Types: Cigarettes  . Smokeless tobacco: Never Used  Substance Use Topics  . Alcohol use: Yes    Comment: occ  . Drug use:  Never     Allergies   Patient has no known allergies.   Review of Systems Review of Systems  Constitutional: Negative for fever.  Musculoskeletal: Positive for arthralgias and myalgias.  Neurological: Negative for numbness.     Physical Exam Updated Vital Signs BP 134/86 (BP Location: Right Arm)   Pulse 72   Temp 98.1 F (36.7 C) (Oral)   Resp 18   Ht 5\' 4"  (1.626 m)   Wt 93.9 kg   SpO2 99%   BMI 35.53 kg/m   Physical Exam Vitals signs and nursing note reviewed.  Constitutional:      General: He is not in acute distress.    Appearance: He is well-developed. He is not diaphoretic.  HENT:     Head: Normocephalic and atraumatic.     Mouth/Throat:     Pharynx: No oropharyngeal exudate.  Eyes:     General: No scleral icterus.       Right eye: No discharge.        Left eye: No discharge.     Conjunctiva/sclera: Conjunctivae normal.     Pupils: Pupils are equal, round, and reactive to light.  Neck:     Musculoskeletal: Normal range of motion and neck supple.     Thyroid: No thyromegaly.  Cardiovascular:  Rate and Rhythm: Normal rate and regular rhythm.     Heart sounds: Normal heart sounds. No murmur. No friction rub. No gallop.   Pulmonary:     Effort: Pulmonary effort is normal. No respiratory distress.     Breath sounds: Normal breath sounds. No stridor. No wheezing or rales.  Musculoskeletal:     Comments: Tenderness in the right hand over the thenar eminence and small amount of tenderness over the first metacarpal; no anatomical snuffbox tenderness; negative Tinel's and reverse Phalen's; cap refill less than 2 seconds, sensation intact, radial pulses intact; able to make a fist with pain, full range of motion of digits  Lymphadenopathy:     Cervical: No cervical adenopathy.  Skin:    General: Skin is warm and dry.     Coloration: Skin is not pale.     Findings: No rash.  Neurological:     Mental Status: He is alert.     Coordination: Coordination  normal.      ED Treatments / Results  Labs (all labs ordered are listed, but only abnormal results are displayed) Labs Reviewed - No data to display  EKG None  Radiology Dg Hand Complete Right  Result Date: 09/30/2019 CLINICAL DATA:  Right hand pain EXAM: RIGHT HAND - COMPLETE 3+ VIEW COMPARISON:  03/07/2019 FINDINGS: There is no evidence of fracture or dislocation. There is no evidence of arthropathy or other focal bone abnormality. Soft tissues are unremarkable. IMPRESSION: Negative. Electronically Signed   By: Rolm Baptise M.D.   On: 09/30/2019 20:30    Procedures Procedures (including critical care time)  Medications Ordered in ED Medications  ibuprofen (ADVIL) tablet 600 mg (600 mg Oral Given 09/30/19 2016)     Initial Impression / Assessment and Plan / ED Course  I have reviewed the triage vital signs and the nursing notes.  Pertinent labs & imaging results that were available during my care of the patient were reviewed by me and considered in my medical decision making (see chart for details).        Patient presenting with right hand pain after packing and using straps at work.  X-ray is negative.  Suspect sprain.  Placed in Velcro thumb spica for comfort.  Ice discussed.  Voltaren gel and Tylenol discussed.  Will avoid oral NSAIDs for home considering patient is on Prolixin.  Follow-up to sports medicine if symptoms not improving.  Patient understands and agrees with plan.  Patient vitals stable throughout ED course and discharged in satisfactory condition.  Final Clinical Impressions(s) / ED Diagnoses   Final diagnoses:  Right hand pain    ED Discharge Orders         Ordered    diclofenac Sodium (VOLTAREN) 1 % GEL  4 times daily     09/30/19 2038    acetaminophen (TYLENOL) 500 MG tablet  Every 6 hours PRN     09/30/19 2038           Frederica Kuster, PA-C 09/30/19 2039    Hayden Rasmussen, MD 10/01/19 [phone]

## 2019-09-30 NOTE — Discharge Instructions (Addendum)
Apply Voltaren gel up to 4 times daily as needed for your pain.  Take Tylenol every 6 hours as needed for your pain.  Wear your brace, especially when using your hand, for support.  Use ice 3-4 times daily alternating 20 minutes on, 20 minutes off.  Rest your hand for the next few days.  If your symptoms are not improving, please follow-up with Dr. Raeford Razor as below for further evaluation and treatment.  Please return to emergency department if you develop any new or worsening symptoms.

## 2019-09-30 NOTE — ED Notes (Signed)
Pt. Reports he pulled a strap with his L hand and injured his L thumb.  Pt. Now c/o L thumb pain.

## 2019-09-30 NOTE — ED Triage Notes (Signed)
Right hand pain. No known injury

## 2019-11-20 ENCOUNTER — Emergency Department (HOSPITAL_BASED_OUTPATIENT_CLINIC_OR_DEPARTMENT_OTHER): Payer: Medicaid Other

## 2019-11-20 ENCOUNTER — Emergency Department (HOSPITAL_BASED_OUTPATIENT_CLINIC_OR_DEPARTMENT_OTHER)
Admission: EM | Admit: 2019-11-20 | Discharge: 2019-11-20 | Disposition: A | Discharge status: Home / Self Care | Payer: Medicaid Other | Attending: Emergency Medicine | Admitting: Emergency Medicine

## 2019-11-20 ENCOUNTER — Encounter (HOSPITAL_BASED_OUTPATIENT_CLINIC_OR_DEPARTMENT_OTHER): Payer: Self-pay | Admitting: *Deleted

## 2019-11-20 ENCOUNTER — Other Ambulatory Visit: Payer: Self-pay

## 2019-11-20 DIAGNOSIS — Z79899 Other long term (current) drug therapy: Secondary | ICD-10-CM | POA: Insufficient documentation

## 2019-11-20 DIAGNOSIS — R1032 Left lower quadrant pain: Secondary | ICD-10-CM | POA: Diagnosis not present

## 2019-11-20 DIAGNOSIS — F17210 Nicotine dependence, cigarettes, uncomplicated: Secondary | ICD-10-CM | POA: Insufficient documentation

## 2019-11-20 DIAGNOSIS — M25552 Pain in left hip: Secondary | ICD-10-CM | POA: Diagnosis present

## 2019-11-20 DIAGNOSIS — F200 Paranoid schizophrenia: Secondary | ICD-10-CM | POA: Diagnosis not present

## 2019-11-20 MED ORDER — NAPROXEN 250 MG PO TABS
500.0000 mg | ORAL_TABLET | Freq: Once | ORAL | Status: AC
  Administered 2019-11-20: 500 mg via ORAL
  Filled 2019-11-20: qty 2

## 2019-11-20 MED ORDER — MELOXICAM 15 MG PO TABS: ORAL_TABLET | ORAL | 0 refills | Status: DC

## 2019-11-20 NOTE — ED Notes (Signed)
Pt. Reports his dad was diagnosed with hips "issues" and he may had a hereditary problem he does not know due to his hip has started to bother him.  Pt. Said the L hip hurts with sitting down and getting up.  Pt. Walks a normal gait when coming from triage to room 4.

## 2019-11-20 NOTE — ED Triage Notes (Signed)
Left hip pain for 2 weeks. No injury. Ambulatory without difficulty.

## 2019-11-20 NOTE — ED Provider Notes (Signed)
[facility] Provider Note: Lowella Dell, MD, FACEP  CSN: [id number] MRN: [id number] ARRIVAL: 11/20/19 at 2223 ROOM: [facility]04/[facility]04   CHIEF COMPLAINT  Hip Pain   HISTORY OF PRESENT ILLNESS  11/20/19 11:10 PM Joe Barrett is a 34 y.o. male with a 2-week history of pain in his left groin fold.  He rates his pain from a 1 to a 3 out of 10.  It is worse with sitting up and minimal when lying supine.  It is also somewhat worse with ambulation.  He denies any injury to his left hip.  He has not taken anything for his symptoms.   Past Medical History:  Diagnosis Date  . Paranoid schizophrenia [facility])     Past Surgical History:  Procedure Laterality Date  . APPENDECTOMY      No family history on file.  Social History   Tobacco Use  . Smoking status: Current Every Day Smoker    Packs/day: 0.50    Types: Cigarettes  . Smokeless tobacco: Never Used  Substance Use Topics  . Alcohol use: Yes    Comment: occ  . Drug use: Never    Prior to Admission medications   Medication Sig Start Date End Date Taking? Authorizing Provider  fluPHENAZine (PROLIXIN) 2.5 MG tablet TK 1 T PO QAM AND 2 TS QHS. DO NOT DRIVE OR OPERATE MACHINERY. DO NOT USE ALCOHOL 02/08/18  Yes [provider]  meloxicam (MOBIC) 15 MG tablet Take 1 tablet daily as needed for hip pain. 11/20/19   Ysidro Ramsay, Jonny Ruiz, MD    Allergies Patient has no known allergies.   REVIEW OF SYSTEMS  Negative except as noted here or in the History of Present Illness.   PHYSICAL EXAMINATION  Initial Vital Signs Blood pressure 115/75, pulse 95, temperature 98.4 F (36.9 C), temperature source Oral, resp. rate 18, height 5\' 4"  (1.626 m), weight 93.9 kg, SpO2 98 %.  Examination General: Well-developed, well-nourished male in no acute distress; appearance consistent with age of record HENT: normocephalic; atraumatic Eyes: Normal appearance Neck: supple Heart: regular rate and rhythm Lungs: clear to  auscultation bilaterally Abdomen: soft; nondistended; nontender; bowel sounds present Extremities: No deformity; full range of motion; pulses normal; no tenderness of left hip, groin fold or SI joint, no hernia palpated Neurologic: Awake, alert and oriented; motor function intact in all extremities and symmetric; no facial droop Skin: Warm and dry Psychiatric: Normal mood and affect   RESULTS  Summary of this visit's results, reviewed and interpreted by myself:   EKG Interpretation  Date/Time:    Ventricular Rate:    PR Interval:    QRS Duration:   QT Interval:    QTC Calculation:   R Axis:     Text Interpretation:        Laboratory Studies: No results found for this or any previous visit (from the past 24 hour(s)). Imaging Studies: DG Hip Unilat W or Wo Pelvis 2-3 Views Left  Result Date: 11/20/2019 CLINICAL DATA:  Left hip pain EXAM: DG HIP (WITH OR WITHOUT PELVIS) 2-3V LEFT COMPARISON:  None. FINDINGS: There is no evidence of hip fracture or dislocation. There is no evidence of arthropathy or other focal bone abnormality. IMPRESSION: Negative. Electronically Signed   By: 01/18/2020 M.D.   On: 11/20/2019 23:26    ED COURSE and MDM  Nursing notes, initial and subsequent vitals signs, including pulse oximetry, reviewed and interpreted by myself.  Vitals:   11/20/19 2230 11/20/19 2231  BP: 115/75  Pulse: 95   Resp: 18   Temp: 98.4 F (36.9 C)   TempSrc: Oral   SpO2: 98%   Weight:  93.9 kg  Height:  5\' 4"  (1.626 m)   Medications  naproxen (NAPROSYN) tablet 500 mg (has no administration in time range)    Radiograph is unremarkable.  The location of the patient's pain suggests inflammation of the proximal quadriceps but he is not tender there.  We will place him on a nonsteroidal and refer to sports medicine if symptoms persist or worsen.  PROCEDURES  Procedures   ED DIAGNOSES     ICD-10-CM   1. Pain in the groin, left  R10.32        Shanon Rosser,  MD 11/20/19 2332

## 2020-01-16 ENCOUNTER — Emergency Department (HOSPITAL_BASED_OUTPATIENT_CLINIC_OR_DEPARTMENT_OTHER)
Admission: EM | Admit: 2020-01-16 | Discharge: 2020-01-16 | Disposition: A | Discharge status: Home / Self Care | Payer: Medicaid Other | Attending: Emergency Medicine | Admitting: Emergency Medicine

## 2020-01-16 ENCOUNTER — Other Ambulatory Visit: Payer: Self-pay

## 2020-01-16 ENCOUNTER — Encounter (HOSPITAL_BASED_OUTPATIENT_CLINIC_OR_DEPARTMENT_OTHER): Payer: Self-pay | Admitting: *Deleted

## 2020-01-16 DIAGNOSIS — Z79899 Other long term (current) drug therapy: Secondary | ICD-10-CM | POA: Diagnosis not present

## 2020-01-16 DIAGNOSIS — Y9389 Activity, other specified: Secondary | ICD-10-CM | POA: Diagnosis not present

## 2020-01-16 DIAGNOSIS — Y929 Unspecified place or not applicable: Secondary | ICD-10-CM | POA: Insufficient documentation

## 2020-01-16 DIAGNOSIS — S76312A Strain of muscle, fascia and tendon of the posterior muscle group at thigh level, left thigh, initial encounter: Secondary | ICD-10-CM | POA: Diagnosis not present

## 2020-01-16 DIAGNOSIS — S8991XA Unspecified injury of right lower leg, initial encounter: Secondary | ICD-10-CM | POA: Diagnosis present

## 2020-01-16 DIAGNOSIS — X501XXA Overexertion from prolonged static or awkward postures, initial encounter: Secondary | ICD-10-CM | POA: Insufficient documentation

## 2020-01-16 DIAGNOSIS — S76319A Strain of muscle, fascia and tendon of the posterior muscle group at thigh level, unspecified thigh, initial encounter: Secondary | ICD-10-CM

## 2020-01-16 DIAGNOSIS — F17210 Nicotine dependence, cigarettes, uncomplicated: Secondary | ICD-10-CM | POA: Diagnosis not present

## 2020-01-16 DIAGNOSIS — Y999 Unspecified external cause status: Secondary | ICD-10-CM | POA: Diagnosis not present

## 2020-01-16 DIAGNOSIS — S76311A Strain of muscle, fascia and tendon of the posterior muscle group at thigh level, right thigh, initial encounter: Secondary | ICD-10-CM | POA: Insufficient documentation

## 2020-01-16 MED ORDER — IBUPROFEN 800 MG PO TABS
800.0000 mg | ORAL_TABLET | Freq: Once | ORAL | Status: AC
  Administered 2020-01-16: 800 mg via ORAL
  Filled 2020-01-16: qty 1

## 2020-01-16 MED ORDER — IBUPROFEN 800 MG PO TABS: 800.0000 mg | ORAL_TABLET | Freq: Three times a day (TID) | ORAL | 0 refills | Status: DC

## 2020-01-16 NOTE — ED Provider Notes (Signed)
[place] EMERGENCY DEPARTMENT Provider Note   CSN: [id number] Arrival date & time: 01/16/20  1212     History Chief Complaint  Patient presents with  . Leg Pain    Joe Barrett is a 34 y.o. male.  Pt presents to the ED today with hamstring and buttock pain.  Pt said he was leaning over for about 6 hrs putting together some furniture.  He is sore there today.  He has not taken anything for his pain.  He was unable to go to work today, so wants a work note.  He denies any bowel or bladder trouble.  He is able to walk.        Past Medical History:  Diagnosis Date  . Paranoid schizophrenia ([place])     There are no problems to display for this patient.   Past Surgical History:  Procedure Laterality Date  . APPENDECTOMY         No family history on file.  Social History   Tobacco Use  . Smoking status: Current Every Day Smoker    Packs/day: 0.50    Types: Cigarettes  . Smokeless tobacco: Never Used  Substance Use Topics  . Alcohol use: Yes    Comment: occ  . Drug use: Never    Home Medications Prior to Admission medications   Medication Sig Start Date End Date Taking? Authorizing Provider  fluPHENAZine (PROLIXIN) 2.5 MG tablet TK 1 T PO QAM AND 2 TS QHS. DO NOT DRIVE OR OPERATE MACHINERY. DO NOT USE ALCOHOL 02/08/18  Yes [provider]  ibuprofen (ADVIL) 800 MG tablet Take 1 tablet (800 mg total) by mouth 3 (three) times daily. 01/16/20   Isla Pence, MD  meloxicam (MOBIC) 15 MG tablet Take 1 tablet daily as needed for hip pain. 11/20/19   Molpus, Jenny Reichmann, MD    Allergies    Patient has no known allergies.  Review of Systems   Review of Systems  Musculoskeletal:       Buttock and hamstring pain  All other systems reviewed and are negative.   Physical Exam Updated Vital Signs BP 118/78   Pulse 80   Temp 98 F (36.7 C) (Oral)   Resp 18   Ht 5\' 3"  (1.6 m)   Wt 97.5 kg   SpO2 100%   BMI 38.09 kg/m   Physical  Exam Vitals and nursing note reviewed.  Constitutional:      Appearance: Normal appearance.  HENT:     Head: Normocephalic and atraumatic.     Right Ear: External ear normal.     Left Ear: External ear normal.     Nose: Nose normal.     Mouth/Throat:     Mouth: Mucous membranes are moist.     Pharynx: Oropharynx is clear.  Eyes:     Extraocular Movements: Extraocular movements intact.     Conjunctiva/sclera: Conjunctivae normal.     Pupils: Pupils are equal, round, and reactive to light.  Cardiovascular:     Rate and Rhythm: Normal rate and regular rhythm.     Pulses: Normal pulses.     Heart sounds: Normal heart sounds.  Pulmonary:     Effort: Pulmonary effort is normal.     Breath sounds: Normal breath sounds.  Abdominal:     General: Abdomen is flat. Bowel sounds are normal.     Palpations: Abdomen is soft.  Musculoskeletal:        General: Normal range of motion.  Cervical back: Normal range of motion and neck supple.  Skin:    General: Skin is warm.     Capillary Refill: Capillary refill takes less than 2 seconds.  Neurological:     General: No focal deficit present.     Mental Status: He is alert and oriented to person, place, and time.  Psychiatric:        Mood and Affect: Mood normal.        Behavior: Behavior normal.     ED Results / Procedures / Treatments   Labs (all labs ordered are listed, but only abnormal results are displayed) Labs Reviewed - No data to display  EKG None  Radiology No results found.  Procedures Procedures (including critical care time)  Medications Ordered in ED Medications  ibuprofen (ADVIL) tablet 800 mg (has no administration in time range)    ED Course  I have reviewed the triage vital signs and the nursing notes.  Pertinent labs & imaging results that were available during my care of the patient were reviewed by me and considered in my medical decision making (see chart for details).    MDM  Rules/Calculators/A&P                     Hamstrings are likely strained due to leaning over for several hrs.  Pt is given a note for work.  Return if worse.  Final Clinical Impression(s) / ED Diagnoses Final diagnoses:  Hamstring muscle strain, unspecified laterality, initial encounter    Rx / DC Orders ED Discharge Orders         Ordered    ibuprofen (ADVIL) 800 MG tablet  3 times daily     01/16/20 1231           Jacalyn Lefevre, MD 01/16/20 1234

## 2020-01-16 NOTE — ED Triage Notes (Signed)
He woke with soreness in his legs. Ambulatory with no difficulty. No known injury.

## 2020-03-09 ENCOUNTER — Emergency Department (HOSPITAL_BASED_OUTPATIENT_CLINIC_OR_DEPARTMENT_OTHER): Payer: Medicaid Other

## 2020-03-09 ENCOUNTER — Other Ambulatory Visit: Payer: Self-pay

## 2020-03-09 ENCOUNTER — Encounter (HOSPITAL_BASED_OUTPATIENT_CLINIC_OR_DEPARTMENT_OTHER): Payer: Self-pay | Admitting: Emergency Medicine

## 2020-03-09 ENCOUNTER — Emergency Department (HOSPITAL_BASED_OUTPATIENT_CLINIC_OR_DEPARTMENT_OTHER)
Admission: EM | Admit: 2020-03-09 | Discharge: 2020-03-09 | Disposition: A | Discharge status: Home / Self Care | Payer: Medicaid Other | Attending: Emergency Medicine | Admitting: Emergency Medicine

## 2020-03-09 DIAGNOSIS — F17210 Nicotine dependence, cigarettes, uncomplicated: Secondary | ICD-10-CM | POA: Diagnosis not present

## 2020-03-09 DIAGNOSIS — R109 Unspecified abdominal pain: Secondary | ICD-10-CM | POA: Diagnosis present

## 2020-03-09 DIAGNOSIS — R197 Diarrhea, unspecified: Secondary | ICD-10-CM | POA: Insufficient documentation

## 2020-03-09 DIAGNOSIS — Z79899 Other long term (current) drug therapy: Secondary | ICD-10-CM | POA: Diagnosis not present

## 2020-03-09 DIAGNOSIS — X58XXXA Exposure to other specified factors, initial encounter: Secondary | ICD-10-CM | POA: Insufficient documentation

## 2020-03-09 DIAGNOSIS — Y9289 Other specified places as the place of occurrence of the external cause: Secondary | ICD-10-CM | POA: Diagnosis not present

## 2020-03-09 DIAGNOSIS — S29012A Strain of muscle and tendon of back wall of thorax, initial encounter: Secondary | ICD-10-CM | POA: Diagnosis not present

## 2020-03-09 DIAGNOSIS — Y999 Unspecified external cause status: Secondary | ICD-10-CM | POA: Insufficient documentation

## 2020-03-09 DIAGNOSIS — Y9389 Activity, other specified: Secondary | ICD-10-CM | POA: Diagnosis not present

## 2020-03-09 DIAGNOSIS — S29019A Strain of muscle and tendon of unspecified wall of thorax, initial encounter: Secondary | ICD-10-CM

## 2020-03-09 LAB — URINALYSIS, ROUTINE W REFLEX MICROSCOPIC
Bilirubin Urine: NEGATIVE
Glucose, UA: NEGATIVE mg/dL
Hgb urine dipstick: NEGATIVE
Ketones, ur: NEGATIVE mg/dL
Leukocytes,Ua: NEGATIVE
Nitrite: NEGATIVE
Protein, ur: NEGATIVE mg/dL
Specific Gravity, Urine: >1.03 — ABNORMAL HIGH (ref 1.005–1.030)
pH: 6 (ref 5.0–8.0)

## 2020-03-09 LAB — CBC WITH DIFFERENTIAL/PLATELET
Abs Immature Granulocytes: 0.04 10*3/uL (ref 0.00–0.07)
Basophils Absolute: 0.1 10*3/uL (ref 0.0–0.1)
Basophils Relative: 1 %
Eosinophils Absolute: 0.4 10*3/uL (ref 0.0–0.5)
Eosinophils Relative: 3 %
HCT: 43.8 % (ref 39.0–52.0)
Hemoglobin: 14.2 g/dL (ref 13.0–17.0)
Immature Granulocytes: 0 %
Lymphocytes Relative: 33 %
Lymphs Abs: 4.1 10*3/uL — ABNORMAL HIGH (ref 0.7–4.0)
MCH: 25.8 pg — ABNORMAL LOW (ref 26.0–34.0)
MCHC: 32.4 g/dL (ref 30.0–36.0)
MCV: 79.6 fL — ABNORMAL LOW (ref 80.0–100.0)
Monocytes Absolute: 1.1 10*3/uL — ABNORMAL HIGH (ref 0.1–1.0)
Monocytes Relative: 9 %
Neutro Abs: 6.7 10*3/uL (ref 1.7–7.7)
Neutrophils Relative %: 54 %
Platelets: 267 10*3/uL (ref 150–400)
RBC: 5.5 MIL/uL (ref 4.22–5.81)
RDW: 15.2 % (ref 11.5–15.5)
Smear Review: NORMAL
WBC: 12.4 10*3/uL — ABNORMAL HIGH (ref 4.0–10.5)
nRBC: 0 % (ref 0.0–0.2)

## 2020-03-09 LAB — COMPREHENSIVE METABOLIC PANEL
ALT: 25 U/L (ref 0–44)
AST: 21 U/L (ref 15–41)
Albumin: 4 g/dL (ref 3.5–5.0)
Alkaline Phosphatase: 71 U/L (ref 38–126)
Anion gap: 8 (ref 5–15)
BUN: 13 mg/dL (ref 6–20)
CO2: 25 mmol/L (ref 22–32)
Calcium: 9.4 mg/dL (ref 8.9–10.3)
Chloride: 105 mmol/L (ref 98–111)
Creatinine, Ser: 0.88 mg/dL (ref 0.61–1.24)
GFR calc Af Amer: >60 mL/min (ref >60)
GFR calc non Af Amer: >60 mL/min (ref >60)
Glucose, Bld: 102 mg/dL — ABNORMAL HIGH (ref 70–99)
Potassium: 3.6 mmol/L (ref 3.5–5.1)
Sodium: 138 mmol/L (ref 135–145)
Total Bilirubin: 0.4 mg/dL (ref 0.3–1.2)
Total Protein: 7.2 g/dL (ref 6.5–8.1)

## 2020-03-09 LAB — D-DIMER, QUANTITATIVE: D-Dimer, Quant: <0.27 ug/mL-FEU (ref 0.00–0.50)

## 2020-03-09 LAB — LIPASE, BLOOD: Lipase: 30 U/L (ref 11–51)

## 2020-03-09 MED ORDER — IOHEXOL 300 MG/ML  SOLN
100.0000 mL | Freq: Once | INTRAMUSCULAR | Status: AC | PRN
  Administered 2020-03-09: 100 mL via INTRAVENOUS

## 2020-03-09 MED ORDER — SODIUM CHLORIDE 0.9 % IV BOLUS
1000.0000 mL | Freq: Once | INTRAVENOUS | Status: AC
  Administered 2020-03-09: 1000 mL via INTRAVENOUS

## 2020-03-09 MED ORDER — METHOCARBAMOL 500 MG PO TABS
500.0000 mg | ORAL_TABLET | Freq: Three times a day (TID) | ORAL | 0 refills | Status: DC | PRN
Start: 2020-03-09 — End: 2020-05-20

## 2020-03-09 NOTE — ED Notes (Signed)
Transported to CT 

## 2020-03-09 NOTE — ED Notes (Signed)
Pt on monitor 

## 2020-03-09 NOTE — ED Triage Notes (Signed)
Right side abd/back pain with diarrhea for 2 days. Denies n/v.

## 2020-03-09 NOTE — ED Provider Notes (Signed)
[facility] Provider Note   CSN: [id number] Arrival date & time: 03/09/20  0147     History Chief Complaint  Patient presents with  . Abdominal Pain    Joe Barrett is a 34 y.o. male.  Patient presents to the emergency department for evaluation of right-sided back pain.  Pain is in the upper right back area.  He has noticed that it worsens with bending, twisting and moving but also if he takes a deep breath.  He has had some associated diarrhea, denies nausea and vomiting.  He has not had any urinary symptoms.  Denies injury.        Past Medical History:  Diagnosis Date  . Paranoid schizophrenia ([place])     There are no problems to display for this patient.   Past Surgical History:  Procedure Laterality Date  . APPENDECTOMY         No family history on file.  Social History   Tobacco Use  . Smoking status: Current Every Day Smoker    Packs/day: 0.50    Types: Cigarettes  . Smokeless tobacco: Never Used  Substance Use Topics  . Alcohol use: Yes    Comment: occ  . Drug use: Never    Home Medications Prior to Admission medications   Medication Sig Start Date End Date Taking? Authorizing Provider  fluPHENAZine (PROLIXIN) 2.5 MG tablet TK 1 T PO QAM AND 2 TS QHS. DO NOT DRIVE OR OPERATE MACHINERY. DO NOT USE ALCOHOL 02/08/18   [provider]  ibuprofen (ADVIL) 800 MG tablet Take 1 tablet (800 mg total) by mouth 3 (three) times daily. 01/16/20   Isla Pence, MD  meloxicam (MOBIC) 15 MG tablet Take 1 tablet daily as needed for hip pain. 11/20/19   Molpus, John, MD  methocarbamol (ROBAXIN) 500 MG tablet Take 1 tablet (500 mg total) by mouth every 8 (eight) hours as needed for muscle spasms. 03/09/20   Orpah Greek, MD    Allergies    Patient has no known allergies.  Review of Systems   Review of Systems  Musculoskeletal: Positive for back pain.  All other systems reviewed and are negative.   Physical  Exam Updated Vital Signs BP 122/84   Pulse 62   Temp 97.9 F (36.6 C) (Oral)   Resp 18   SpO2 98%   Physical Exam Vitals and nursing note reviewed.  Constitutional:      General: He is not in acute distress.    Appearance: Normal appearance. He is well-developed.  HENT:     Head: Normocephalic and atraumatic.     Right Ear: Hearing normal.     Left Ear: Hearing normal.     Nose: Nose normal.  Eyes:     Conjunctiva/sclera: Conjunctivae normal.     Pupils: Pupils are equal, round, and reactive to light.  Cardiovascular:     Rate and Rhythm: Regular rhythm.     Heart sounds: S1 normal and S2 normal. No murmur. No friction rub. No gallop.   Pulmonary:     Effort: Pulmonary effort is normal. No respiratory distress.     Breath sounds: Normal breath sounds.  Chest:     Chest wall: No tenderness.  Abdominal:     General: Bowel sounds are normal.     Palpations: Abdomen is soft.     Tenderness: There is no abdominal tenderness. There is no guarding or rebound. Negative signs include Murphy's sign and McBurney's sign.  Hernia: No hernia is present.  Musculoskeletal:        General: Normal range of motion.     Cervical back: Normal range of motion and neck supple.     Thoracic back: Tenderness present.       Back:  Skin:    General: Skin is warm and dry.     Findings: No rash.  Neurological:     Mental Status: He is alert and oriented to person, place, and time.     GCS: GCS eye subscore is 4. GCS verbal subscore is 5. GCS motor subscore is 6.     Cranial Nerves: No cranial nerve deficit.     Sensory: No sensory deficit.     Coordination: Coordination normal.  Psychiatric:        Speech: Speech normal.        Behavior: Behavior normal.        Thought Content: Thought content normal.     ED Results / Procedures / Treatments   Labs (all labs ordered are listed, but only abnormal results are displayed) Labs Reviewed  URINALYSIS, ROUTINE W REFLEX MICROSCOPIC -  Abnormal; Notable for the following components:      Result Value   Specific Gravity, Urine >1.030 (*)    All other components within normal limits  CBC WITH DIFFERENTIAL/PLATELET - Abnormal; Notable for the following components:   WBC 12.4 (*)    MCV 79.6 (*)    MCH 25.8 (*)    Lymphs Abs 4.1 (*)    Monocytes Absolute 1.1 (*)    All other components within normal limits  COMPREHENSIVE METABOLIC PANEL - Abnormal; Notable for the following components:   Glucose, Bld 102 (*)    All other components within normal limits  LIPASE, BLOOD  D-DIMER, QUANTITATIVE (NOT AT [facility])    EKG EKG Interpretation  Date/Time:  Tuesday March 09 2020 02:34:52 EDT Ventricular Rate:  70 PR Interval:    QRS Duration: 96 QT Interval:  375 QTC Calculation: 405 R Axis:   46 Text Interpretation: Sinus rhythm Borderline T abnormalities, inferior leads Baseline wander in lead(s) V3 Confirmed by Gilda Crease [phone]) on 03/09/2020 3:18:53 AM   Radiology CT ABDOMEN PELVIS W CONTRAST  Result Date: 03/09/2020 CLINICAL DATA:  Right-sided abdominal and back pain for 2 days. EXAM: CT ABDOMEN AND PELVIS WITH CONTRAST TECHNIQUE: Multidetector CT imaging of the abdomen and pelvis was performed using the standard protocol following bolus administration of intravenous contrast. CONTRAST:  OMNIPAQUE IOHEXOL 300 MG/ML  SOLN COMPARISON:  None. FINDINGS: Lower chest:  No contributory findings. Hepatobiliary: No focal liver abnormality.No evidence of biliary obstruction or stone. Pancreas: Unremarkable. Spleen: Unremarkable. Adrenals/Urinary Tract: Negative adrenals. No hydronephrosis or stone. Unremarkable bladder. Stomach/Bowel: No obstruction. No evidence of bowel inflammation. Appendectomy. Vascular/Lymphatic: No acute vascular abnormality. No mass or adenopathy. Reproductive:No pathologic findings. Other: No ascites or pneumoperitoneum. Musculoskeletal: No acute abnormalities. IMPRESSION: Negative abdominal CT.   No explanation for abdominal pain. Electronically Signed   By: Marnee Spring M.D.   On: 03/09/2020 05:33   DG Chest Port 1 View  Result Date: 03/09/2020 CLINICAL DATA:  Right flank pain EXAM: PORTABLE CHEST 1 VIEW COMPARISON:  None. FINDINGS: The heart size and mediastinal contours are within normal limits. Overall shallow degree of aeration. Subsegmental atelectasis seen at the right lung base. The visualized skeletal structures are unremarkable. IMPRESSION: Shallow degree of aeration with subsegmental atelectasis at the right lung base. Electronically Signed   By: Jonna Clark  M.D.   On: 03/09/2020 04:19    Procedures Procedures (including critical care time)  Medications Ordered in ED Medications  sodium chloride 0.9 % bolus 1,000 mL (0 mLs Intravenous Stopped 03/09/20 0344)  iohexol (OMNIPAQUE) 300 MG/ML solution 100 mL (100 mLs Intravenous Contrast Given 03/09/20 0518)    ED Course  I have reviewed the triage vital signs and the nursing notes.  Pertinent labs & imaging results that were available during my care of the patient were reviewed by me and considered in my medical decision making (see chart for details).    MDM Rules/Calculators/A&P                      Patient presents to the emergency department for evaluation of right-sided upper back pain has been ongoing for 2 days.  He has had some associated diarrhea.  Abdominal exam is benign.  He has noticed some pain with movement and also taking of breath.  This appears to be more musculoskeletal in nature then pleuritic.  D-dimer is negative.  No tachycardia.  No tachypnea or hypoxia.  It is therefore felt that PE is not likely.  No cardiac symptoms.  Chest x-ray shows atelectasis at the right base but no infiltrates or other pathology.  CT abdomen pelvis performed to rule out intra-abdominal pathology and referred pain.  CT did not show any acute pathology.  Final Clinical Impression(s) / ED Diagnoses Final diagnoses:    Thoracic myofascial strain, initial encounter    Rx / DC Orders ED Discharge Orders         Ordered    methocarbamol (ROBAXIN) 500 MG tablet  Every 8 hours PRN     03/09/20 0540           Gilda Crease, MD 03/09/20 [phone]

## 2020-03-09 NOTE — Discharge Instructions (Signed)
Rest the area.  You may use heat intermittently on the area.  Take ibuprofen as needed for pain.

## 2020-05-20 ENCOUNTER — Emergency Department (HOSPITAL_BASED_OUTPATIENT_CLINIC_OR_DEPARTMENT_OTHER): Payer: Medicaid Other

## 2020-05-20 ENCOUNTER — Emergency Department (HOSPITAL_BASED_OUTPATIENT_CLINIC_OR_DEPARTMENT_OTHER)
Admission: EM | Admit: 2020-05-20 | Discharge: 2020-05-20 | Disposition: A | Discharge status: Home / Self Care | Payer: Medicaid Other | Attending: Emergency Medicine | Admitting: Emergency Medicine

## 2020-05-20 ENCOUNTER — Other Ambulatory Visit: Payer: Self-pay

## 2020-05-20 ENCOUNTER — Encounter (HOSPITAL_BASED_OUTPATIENT_CLINIC_OR_DEPARTMENT_OTHER): Payer: Self-pay | Admitting: *Deleted

## 2020-05-20 DIAGNOSIS — F17210 Nicotine dependence, cigarettes, uncomplicated: Secondary | ICD-10-CM | POA: Insufficient documentation

## 2020-05-20 DIAGNOSIS — M25561 Pain in right knee: Secondary | ICD-10-CM | POA: Diagnosis present

## 2020-05-20 MED ORDER — NAPROXEN 250 MG PO TABS: 500.0000 mg | ORAL_TABLET | Freq: Once | ORAL | Status: DC

## 2020-05-20 MED ORDER — NAPROXEN 500 MG PO TABS: 500.0000 mg | ORAL_TABLET | Freq: Two times a day (BID) | ORAL | 0 refills | Status: AC

## 2020-05-20 NOTE — ED Notes (Signed)
Pt denied ace wrap or Naprosyn.

## 2020-05-20 NOTE — ED Provider Notes (Signed)
[facility] EMERGENCY DEPARTMENT Provider Note   CSN: [id number] Arrival date & time: 05/20/20  1313     History Chief Complaint  Patient presents with  . Knee Pain    Joe Barrett is a 34 y.o. male with past history significant for paranoid schizophrenia.  HPI Presents to emergency room today with chief complaint of progressively worsening right knee pain x2 days. He describes the pain as an intermittent dull ache. He has no pain currently. Patient also reports that his right knee feels weak when walking up stairs. He describes it as feeling as if the knee might "buckle." No medications take for his symptoms prior to arrival. He denies any recent fall, trauma, or injury. He denies any fever, chills, back pain, lower extremity numbness or tingling.     Past Medical History:  Diagnosis Date  . Paranoid schizophrenia (HCC)     There are no problems to display for this patient.   Past Surgical History:  Procedure Laterality Date  . APPENDECTOMY         No family history on file.  Social History   Tobacco Use  . Smoking status: Current Every Day Smoker    Packs/day: 0.50    Types: Cigarettes  . Smokeless tobacco: Never Used  Vaping Use  . Vaping Use: Never used  Substance Use Topics  . Alcohol use: Yes    Comment: occ  . Drug use: Never    Home Medications Prior to Admission medications   Medication Sig Start Date End Date Taking? Authorizing Provider  fluPHENAZine (PROLIXIN) 2.5 MG tablet TK 1 T PO QAM AND 2 TS QHS. DO NOT DRIVE OR OPERATE MACHINERY. DO NOT USE ALCOHOL 02/08/18  Yes [provider]  naproxen (NAPROSYN) 500 MG tablet Take 1 tablet (500 mg total) by mouth 2 (two) times daily for 7 days. 05/20/20 05/27/20  Denaly Gatling, Caroleen Hamman, PA-C    Allergies    Patient has no known allergies.  Review of Systems   Review of Systems All other systems are reviewed and are negative for acute change except as noted in the HPI.  Physical  Exam Updated Vital Signs BP 120/70   Pulse 70   Temp 97.9 F (36.6 C) (Oral)   Resp 16   Ht 5\' 3"  (1.6 m)   Wt 95.3 kg   SpO2 98%   BMI 37.20 kg/m   Physical Exam Vitals and nursing note reviewed.  Constitutional:      Appearance: He is well-developed. He is not ill-appearing or toxic-appearing.  HENT:     Head: Normocephalic and atraumatic.     Nose: Nose normal.  Eyes:     General: No scleral icterus.       Right eye: No discharge.        Left eye: No discharge.     Conjunctiva/sclera: Conjunctivae normal.  Neck:     Vascular: No JVD.  Cardiovascular:     Rate and Rhythm: Normal rate and regular rhythm.     Pulses: Normal pulses.          Dorsalis pedis pulses are 2+ on the right side and 2+ on the left side.     Heart sounds: Normal heart sounds.  Pulmonary:     Effort: Pulmonary effort is normal.     Breath sounds: Normal breath sounds.  Abdominal:     General: There is no distension.  Musculoskeletal:        General: Normal range of motion.  Cervical back: Normal range of motion.     Right hip: Normal.     Right upper leg: Normal.     Right knee: No swelling, deformity, effusion, erythema or crepitus. Normal range of motion. Normal alignment, normal meniscus and normal patellar mobility.     Instability Tests: Anterior drawer test negative. Posterior drawer test negative.     Right lower leg: Normal. No edema.     Left lower leg: No edema.  Skin:    General: Skin is warm and dry.  Neurological:     Mental Status: He is oriented to person, place, and time.     GCS: GCS eye subscore is 4. GCS verbal subscore is 5. GCS motor subscore is 6.     Comments: Fluent speech, no facial droop.  Sensation grossly intact to light touch in the lower extremities bilaterally. No saddle anesthesias. Strength 5/5 with flexion and extension at the bilateral hips, knees, and ankles. No noted gait deficit. Coordination intact with heel to shin testing.   Psychiatric:         Behavior: Behavior normal.     ED Results / Procedures / Treatments   Labs (all labs ordered are listed, but only abnormal results are displayed) Labs Reviewed - No data to display  EKG None  Radiology DG Knee Complete 4 Views Right  Result Date: 05/20/2020 CLINICAL DATA:  Right knee pain EXAM: RIGHT KNEE - COMPLETE 4+ VIEW COMPARISON:  None. FINDINGS: No evidence of fracture, dislocation, or joint effusion. No evidence of arthropathy or other focal bone abnormality. Soft tissues are unremarkable. IMPRESSION: Negative. Electronically Signed   By: Helyn Numbers MD   On: 05/20/2020 13:54    Procedures Procedures (including critical care time)  Medications Ordered in ED Medications  naproxen (NAPROSYN) tablet 500 mg (has no administration in time range)    ED Course  I have reviewed the triage vital signs and the nursing notes.  Pertinent labs & imaging results that were available during my care of the patient were reviewed by me and considered in my medical decision making (see chart for details).    MDM Rules/Calculators/A&P                          History provided by patient with additional history obtained from chart review.    Patient presents to the ED with complaints of right lower extremity feeling weak, no pain. Ambulates with normal gait. Sensation intact. No focal weakness. There is no obvious deformity or open wounds. ROM intact. Patella is non tender to palpation. NVI distally. Xray negative for fracture/dislocation. Therapeutic splint provided.Marland Kitchen PRICE and motrin recommended. I discussed results, treatment plan, need for follow-up, and return precautions with the patient. Provided opportunity for questions, patient confirmed understanding and are in agreement with plan.   Portions of this note were generated with Scientist, clinical (histocompatibility and immunogenetics). Dictation errors may occur despite best attempts at proofreading.    Final Clinical Impression(s) / ED Diagnoses Final  diagnoses:  Acute pain of right knee    Rx / DC Orders ED Discharge Orders         Ordered    naproxen (NAPROSYN) 500 MG tablet  2 times daily     Discontinue  Reprint     05/20/20 1417           Zanyiah Posten, Caroleen Hamman, PA-C 05/20/20 1752    Alvira Monday, MD 05/20/20 2308

## 2020-05-20 NOTE — Discharge Instructions (Addendum)
Follow-up with the sports medicine doctor, Dr. Jordan Likes.  Call his office to schedule the next available appointment.  You can wear the Ace wrap to help with compression.  You can also buy an over-the-counter knee sleeve as we discussed.  Prescription sent to the pharmacy for naproxen.  This is an anti-inflammatory.  Take with food so it does not cause upset stomach.  Do not take any additional Aleve, Motrin, ibuprofen, or Goody powders at the same time as these medicines are all similar.  You can elevate your leg to help with swelling.

## 2020-05-20 NOTE — ED Triage Notes (Signed)
Right knee is swollen and feels like it will "buckle" when he walks up stairs. Started 2 days ago.  No known injury.

## 2020-05-24 ENCOUNTER — Other Ambulatory Visit: Payer: Self-pay

## 2020-05-24 ENCOUNTER — Ambulatory Visit (INDEPENDENT_AMBULATORY_CARE_PROVIDER_SITE_OTHER): Payer: Medicaid Other | Admitting: Family Medicine

## 2020-05-24 ENCOUNTER — Encounter: Payer: Self-pay | Admitting: Family Medicine

## 2020-05-24 ENCOUNTER — Ambulatory Visit: Payer: Self-pay

## 2020-05-24 VITALS — BP 123/81 | HR 61 | Ht 63.0 in | Wt 210.0 lb

## 2020-05-24 DIAGNOSIS — M25461 Effusion, right knee: Secondary | ICD-10-CM | POA: Diagnosis present

## 2020-05-24 DIAGNOSIS — M25561 Pain in right knee: Secondary | ICD-10-CM

## 2020-05-24 NOTE — Patient Instructions (Signed)
Nice to meet you Please try ice  Please try the brace  Please try the exercises   Please send me a message in MyChart with any questions or updates.  Please see me back in 4 weeks.   --Dr. Vennie Salsbury  

## 2020-05-24 NOTE — Assessment & Plan Note (Signed)
No inciting injury or event.  Does have a mild effusion.  Does not appear to be related to the meniscus.  May have a gout component. -Counseled on home exercise therapy and supportive care. -Could consider uric acid testing. -Could consider injection and physical therapy.

## 2020-05-24 NOTE — Progress Notes (Signed)
Joe Barrett - 34 y.o. male MRN [id number]  Date of birth: [birth year]  SUBJECTIVE:  Including CC & ROS.  Chief Complaint  Patient presents with  . Knee Pain    right x 1 week    Joe Barrett is a 34 y.o. male that is presenting with right knee pain.  He was seen in the emergency department on 7/8.  Has been doing well over the past few days.  Denies any history of similar symptoms.  No surgery.  He feels like his knee will buckle from time to time.  It was swollen initially..  Independent review of the right knee x-ray from 7/8 shows no acute abnormality.   Review of Systems See HPI   HISTORY: Past Medical, Surgical, Social, and Family History Reviewed & Updated per EMR.   Pertinent Historical Findings include:  Past Medical History:  Diagnosis Date  . Paranoid schizophrenia [facility])     Past Surgical History:  Procedure Laterality Date  . APPENDECTOMY      No family history on file.  Social History   Socioeconomic History  . Marital status: Married    Spouse name: Not on file  . Number of children: Not on file  . Years of education: Not on file  . Highest education level: Not on file  Occupational History  . Not on file  Tobacco Use  . Smoking status: Current Every Day Smoker    Packs/day: 0.50    Types: Cigarettes  . Smokeless tobacco: Never Used  Vaping Use  . Vaping Use: Never used  Substance and Sexual Activity  . Alcohol use: Yes    Comment: occ  . Drug use: Never  . Sexual activity: Not on file  Other Topics Concern  . Not on file  Social History Narrative  . Not on file   Social Determinants of Health   Financial Resource Strain:   . Difficulty of Paying Living Expenses:   Food Insecurity:   . Worried About Programme researcher, broadcasting/film/video in the Last Year:   . Barista in the Last Year:   Transportation Needs:   . Freight forwarder (Medical):   Marland Kitchen Lack of Transportation (Non-Medical):   Physical Activity:   . Days of Exercise  per Week:   . Minutes of Exercise per Session:   Stress:   . Feeling of Stress :   Social Connections:   . Frequency of Communication with Friends and Family:   . Frequency of Social Gatherings with Friends and Family:   . Attends Religious Services:   . Active Member of Clubs or Organizations:   . Attends Banker Meetings:   Marland Kitchen Marital Status:   Intimate Partner Violence:   . Fear of Current or Ex-Partner:   . Emotionally Abused:   Marland Kitchen Physically Abused:   . Sexually Abused:      PHYSICAL EXAM:  VS: BP 123/81   Pulse 61   Ht 5\' 3"  (1.6 m)   Wt 210 lb (95.3 kg)   BMI 37.20 kg/m  Physical Exam Gen: NAD, alert, cooperative with exam, well-appearing MSK:  Right knee: No obvious effusion. Normal range of motion. No instability with valgus or varus stress testing. Negative McMurray's test. Neurovascularly intact  Limited ultrasound: Right knee:  Mild effusion. Normal-appearing quadricep and patellar tendon. No significant change in the medial compartment. Normal-appearing lateral meniscus.  Some hyperemia of the lateral views of the suprapatellar pouch.  Summary: No significant structural changes observed  Ultrasound and interpretation by Clare Gandy, MD    ASSESSMENT & PLAN:   Knee effusion, right No inciting injury or event.  Does have a mild effusion.  Does not appear to be related to the meniscus.  May have a gout component. -Counseled on home exercise therapy and supportive care. -Could consider uric acid testing. -Could consider injection and physical therapy.

## 2020-06-14 ENCOUNTER — Other Ambulatory Visit: Payer: Self-pay

## 2020-06-14 ENCOUNTER — Emergency Department (HOSPITAL_BASED_OUTPATIENT_CLINIC_OR_DEPARTMENT_OTHER)
Admission: EM | Admit: 2020-06-14 | Discharge: 2020-06-14 | Disposition: A | Discharge status: Home / Self Care | Payer: Medicaid Other | Attending: Emergency Medicine | Admitting: Emergency Medicine

## 2020-06-14 ENCOUNTER — Encounter (HOSPITAL_BASED_OUTPATIENT_CLINIC_OR_DEPARTMENT_OTHER): Payer: Self-pay

## 2020-06-14 DIAGNOSIS — Z5321 Procedure and treatment not carried out due to patient leaving prior to being seen by health care provider: Secondary | ICD-10-CM | POA: Diagnosis not present

## 2020-06-14 DIAGNOSIS — M25561 Pain in right knee: Secondary | ICD-10-CM | POA: Insufficient documentation

## 2020-06-14 NOTE — ED Triage Notes (Addendum)
Pt c/o right knee pain x 3 weeks-pt is wearing knee brace-states he was seen here for same-has ortho appt tomorrow-here today due to swelling-NAD-steady gait

## 2020-06-15 ENCOUNTER — Other Ambulatory Visit: Payer: Self-pay

## 2020-06-15 ENCOUNTER — Encounter: Payer: Self-pay | Admitting: Family Medicine

## 2020-06-15 ENCOUNTER — Ambulatory Visit (INDEPENDENT_AMBULATORY_CARE_PROVIDER_SITE_OTHER): Payer: Medicaid Other | Admitting: Family Medicine

## 2020-06-15 ENCOUNTER — Ambulatory Visit: Payer: Self-pay

## 2020-06-15 VITALS — BP 134/82 | HR 58 | Ht 63.0 in | Wt 211.0 lb

## 2020-06-15 DIAGNOSIS — M25461 Effusion, right knee: Secondary | ICD-10-CM

## 2020-06-15 NOTE — Progress Notes (Signed)
Joe Barrett - 34 y.o. male MRN [id number]  Date of birth: [birth year]  SUBJECTIVE:  Including CC & ROS.  Chief Complaint  Patient presents with  . Follow-up    right knee    Joe Barrett is a 34 y.o. male that is presenting with a knee effusion.  He denies any inciting event or trauma.  No significant pain today.  He may have a family component of gout.   Review of Systems See HPI   HISTORY: Past Medical, Surgical, Social, and Family History Reviewed & Updated per EMR.   Pertinent Historical Findings include:  Past Medical History:  Diagnosis Date  . Paranoid schizophrenia [facility])     Past Surgical History:  Procedure Laterality Date  . APPENDECTOMY      No family history on file.  Social History   Socioeconomic History  . Marital status: Married    Spouse name: Not on file  . Number of children: Not on file  . Years of education: Not on file  . Highest education level: Not on file  Occupational History  . Not on file  Tobacco Use  . Smoking status: Current Every Day Smoker    Packs/day: 0.50    Types: Cigarettes  . Smokeless tobacco: Never Used  Vaping Use  . Vaping Use: Never used  Substance and Sexual Activity  . Alcohol use: Yes    Comment: occ  . Drug use: Never  . Sexual activity: Not on file  Other Topics Concern  . Not on file  Social History Narrative  . Not on file   Social Determinants of Health   Financial Resource Strain:   . Difficulty of Paying Living Expenses:   Food Insecurity:   . Worried About Programme researcher, broadcasting/film/video in the Last Year:   . Barista in the Last Year:   Transportation Needs:   . Freight forwarder (Medical):   Marland Kitchen Lack of Transportation (Non-Medical):   Physical Activity:   . Days of Exercise per Week:   . Minutes of Exercise per Session:   Stress:   . Feeling of Stress :   Social Connections:   . Frequency of Communication with Friends and Family:   . Frequency of Social Gatherings with  Friends and Family:   . Attends Religious Services:   . Active Member of Clubs or Organizations:   . Attends Banker Meetings:   Marland Kitchen Marital Status:   Intimate Partner Violence:   . Fear of Current or Ex-Partner:   . Emotionally Abused:   Marland Kitchen Physically Abused:   . Sexually Abused:      PHYSICAL EXAM:  VS: BP 134/82   Pulse (!) 58   Ht 5\' 3"  (1.6 m)   Wt 211 lb (95.7 kg)   BMI 37.38 kg/m  Physical Exam Gen: NAD, alert, cooperative with exam, well-appearing MSK:  Right knee: Mild effusion. Normal range of motion. No tenderness to palpation of the medial or lateral joint space. Neurovascular intact   Aspiration/Injection Procedure Note Joe Barrett [birth year]  Procedure: Aspiration Indications: Right knee effusion  Procedure Details Consent: Risks of procedure as well as the alternatives and risks of each were explained to the (patient/caregiver).  Consent for procedure obtained. Time Out: Verified patient identification, verified procedure, site/side was marked, verified correct patient position, special equipment/implants available, medications/allergies/relevent history reviewed, required imaging and test results available.  Performed.  The area was cleaned with iodine and alcohol swabs.    The right  knee superior lateral suprapatellar pouch was injected using 5 cc's of 1% lidocaine with a 25 1 1/2" needle.  An 18-gauge 1-1/2 inch needle was inserted to achieve aspiration.  Ultrasound was used. Images were obtained in long views showing the injection.    Amount of Fluid Aspirated: 67mL Character of Fluid: straw colored and cloudy Fluid was sent BJY:NWGN count and crystal identification  A sterile dressing was applied.  Patient did tolerate procedure well.     ASSESSMENT & PLAN:   Knee effusion, right Effusion has been present again.  Denies any injury or inciting event.  Denies any significant pain today.  May have a family component of  gout. -Counseled on supportive care. -Counseled on compression. -Evaluate for synovial fluid and crystal structure.

## 2020-06-15 NOTE — Assessment & Plan Note (Signed)
Effusion has been present again.  Denies any injury or inciting event.  Denies any significant pain today.  May have a family component of gout. -Counseled on supportive care. -Counseled on compression. -Evaluate for synovial fluid and crystal structure.

## 2020-06-15 NOTE — Patient Instructions (Signed)
Good to see you Please continue compression  We will call with the results   Please send me a message in MyChart with any questions or updates.  Please see me back in 6-8 weeks.   --Dr. Jordan Likes

## 2020-06-16 ENCOUNTER — Telehealth: Payer: Self-pay | Admitting: Family Medicine

## 2020-06-16 LAB — SYNOVIAL FLUID, CELL COUNT
Eos, Fluid: 1 %
Lining Cells, Synovial: 0 %
Lymphs, Fluid: 30 %
Macrophages Fld: 52 %
Nuc cell # Fld: 636 cells/uL — ABNORMAL HIGH (ref 0–200)
Polys, Fluid: 17 %
RBC, Fluid: 4000 /uL

## 2020-06-16 NOTE — Telephone Encounter (Signed)
Informed of results.   Myra Rude, MD [facility] 06/16/2020, 1:57 PM

## 2020-06-22 ENCOUNTER — Ambulatory Visit: Payer: Medicaid Other | Admitting: Family Medicine

## 2020-07-30 ENCOUNTER — Ambulatory Visit (INDEPENDENT_AMBULATORY_CARE_PROVIDER_SITE_OTHER): Payer: Medicaid Other | Admitting: Family Medicine

## 2020-07-30 ENCOUNTER — Other Ambulatory Visit: Payer: Self-pay

## 2020-07-30 DIAGNOSIS — M25461 Effusion, right knee: Secondary | ICD-10-CM

## 2020-07-30 NOTE — Assessment & Plan Note (Signed)
Symptoms have improved overall.  Feels swelling from time to time. -Counseled on supportive care. -Could consider physical therapy or MRI.

## 2020-07-30 NOTE — Progress Notes (Signed)
  Joe Barrett - 33 y.o. male MRN [id number]  Date of birth: 10/02/1986  SUBJECTIVE:  Including CC & ROS.  Chief Complaint  Patient presents with  . Follow-up    right knee    Joe Barrett is a 34 y.o. male that is following up for his right knee.  Reports that his swelling has improved but does return from time to time.  No significant pain and no mechanical symptoms.   Review of Systems See HPI   HISTORY: Past Medical, Surgical, Social, and Family History Reviewed & Updated per EMR.   Pertinent Historical Findings include:  Past Medical History:  Diagnosis Date  . Paranoid schizophrenia [facility])     Past Surgical History:  Procedure Laterality Date  . APPENDECTOMY      No family history on file.  Social History   Socioeconomic History  . Marital status: Married    Spouse name: Not on file  . Number of children: Not on file  . Years of education: Not on file  . Highest education level: Not on file  Occupational History  . Not on file  Tobacco Use  . Smoking status: Current Every Day Smoker    Packs/day: 0.50    Types: Cigarettes  . Smokeless tobacco: Never Used  Vaping Use  . Vaping Use: Never used  Substance and Sexual Activity  . Alcohol use: Yes    Comment: occ  . Drug use: Never  . Sexual activity: Not on file  Other Topics Concern  . Not on file  Social History Narrative  . Not on file   Social Determinants of Health   Financial Resource Strain:   . Difficulty of Paying Living Expenses: Not on file  Food Insecurity:   . Worried About Programme researcher, broadcasting/film/video in the Last Year: Not on file  . Ran Out of Food in the Last Year: Not on file  Transportation Needs:   . Lack of Transportation (Medical): Not on file  . Lack of Transportation (Non-Medical): Not on file  Physical Activity:   . Days of Exercise per Week: Not on file  . Minutes of Exercise per Session: Not on file  Stress:   . Feeling of Stress : Not on file  Social Connections:    . Frequency of Communication with Friends and Family: Not on file  . Frequency of Social Gatherings with Friends and Family: Not on file  . Attends Religious Services: Not on file  . Active Member of Clubs or Organizations: Not on file  . Attends Banker Meetings: Not on file  . Marital Status: Not on file  Intimate Partner Violence:   . Fear of Current or Ex-Partner: Not on file  . Emotionally Abused: Not on file  . Physically Abused: Not on file  . Sexually Abused: Not on file     PHYSICAL EXAM:  VS: There were no vitals taken for this visit. Physical Exam Gen: NAD, alert, cooperative with exam, well-appearing     ASSESSMENT & PLAN:   Knee effusion, right Symptoms have improved overall.  Feels swelling from time to time. -Counseled on supportive care. -Could consider physical therapy or MRI.

## 2020-10-13 ENCOUNTER — Other Ambulatory Visit: Payer: Self-pay

## 2020-10-13 ENCOUNTER — Emergency Department (HOSPITAL_BASED_OUTPATIENT_CLINIC_OR_DEPARTMENT_OTHER)
Admission: EM | Admit: 2020-10-13 | Discharge: 2020-10-13 | Disposition: A | Discharge status: Home / Self Care | Payer: Medicaid Other | Attending: Emergency Medicine | Admitting: Emergency Medicine

## 2020-10-13 ENCOUNTER — Encounter (HOSPITAL_BASED_OUTPATIENT_CLINIC_OR_DEPARTMENT_OTHER): Payer: Self-pay | Admitting: *Deleted

## 2020-10-13 DIAGNOSIS — Z5321 Procedure and treatment not carried out due to patient leaving prior to being seen by health care provider: Secondary | ICD-10-CM | POA: Insufficient documentation

## 2020-10-13 DIAGNOSIS — M542 Cervicalgia: Secondary | ICD-10-CM | POA: Insufficient documentation

## 2020-10-13 NOTE — ED Notes (Signed)
No answer in lobby at 1621 for room

## 2020-10-13 NOTE — ED Triage Notes (Signed)
C/o right neck pain x 1 day increased  Pain with movt , denies injury

## 2020-12-29 ENCOUNTER — Emergency Department (HOSPITAL_BASED_OUTPATIENT_CLINIC_OR_DEPARTMENT_OTHER)
Admission: EM | Admit: 2020-12-29 | Discharge: 2020-12-29 | Disposition: A | Discharge status: Home / Self Care | Payer: Medicaid Other | Attending: Emergency Medicine | Admitting: Emergency Medicine

## 2020-12-29 ENCOUNTER — Other Ambulatory Visit: Payer: Self-pay

## 2020-12-29 ENCOUNTER — Encounter (HOSPITAL_BASED_OUTPATIENT_CLINIC_OR_DEPARTMENT_OTHER): Payer: Self-pay | Admitting: *Deleted

## 2020-12-29 DIAGNOSIS — F17210 Nicotine dependence, cigarettes, uncomplicated: Secondary | ICD-10-CM | POA: Insufficient documentation

## 2020-12-29 DIAGNOSIS — S46811A Strain of other muscles, fascia and tendons at shoulder and upper arm level, right arm, initial encounter: Secondary | ICD-10-CM | POA: Insufficient documentation

## 2020-12-29 DIAGNOSIS — X500XXA Overexertion from strenuous movement or load, initial encounter: Secondary | ICD-10-CM | POA: Diagnosis not present

## 2020-12-29 DIAGNOSIS — S4991XA Unspecified injury of right shoulder and upper arm, initial encounter: Secondary | ICD-10-CM | POA: Diagnosis present

## 2020-12-29 NOTE — ED Triage Notes (Signed)
Pt reports that he was lifting weights yesterday, attempted to lift too much and pulled a muscle in his left shoulder. Increased pain with movement and palpation. No deformity noted.

## 2020-12-29 NOTE — ED Provider Notes (Signed)
[facility] EMERGENCY DEPARTMENT Provider Note   CSN: [id number] Arrival date & time: 12/29/20  1847     History Chief Complaint  Patient presents with  . Shoulder Pain    Joe Barrett is a 35 y.o. male history of paranoid schizophrenia.  Patient presents with chief complaint of right trapezius pain.  Patient reports that pain started yesterday after bench pressing.  Pain is intermittent and only present when lifting his arm.  Patient denies any pain at rest with arm by his side.  Patient is right-hand dominant.    Patient reports that pain began when he was bench pressing.  He states that he had trouble getting the bar back on the rack and helped a pull in his right trapezius muscle while attempting to do.  After completing the work that he noticed tightness..  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    Patient denies any numbness or tingling to extremities, weakness to extremities, neck or back pain, fever, chills.  HPI     Past Medical History:  Diagnosis Date  . Paranoid schizophrenia [facility])     Patient Active Problem List   Diagnosis Date Noted  . Knee effusion, right 05/24/2020    Past Surgical History:  Procedure Laterality Date  . APPENDECTOMY         History reviewed. No pertinent family history.  Social History   Tobacco Use  . Smoking status: Current Every Day Smoker    Packs/day: 0.50    Types: Cigarettes  . Smokeless tobacco: Never Used  Vaping Use  . Vaping Use: Never used  Substance Use Topics  . Alcohol use: Yes    Comment: occ  . Drug use: Never    Home Medications Prior to Admission medications   Medication Sig Start Date End Date Taking? Authorizing Provider  fluPHENAZine (PROLIXIN) 2.5 MG tablet TK 1 T PO QAM AND 2 TS QHS. DO NOT DRIVE OR OPERATE MACHINERY. DO NOT USE ALCOHOL 02/08/18   [provider]    Allergies    Patient has no known allergies.  Review of Systems   Review of Systems  Constitutional: Negative for chills and fever.  Musculoskeletal: Positive for myalgias. Negative for arthralgias, back pain, gait problem, joint swelling, neck pain and neck stiffness.  Skin: Negative for color change, rash and wound.  Neurological: Negative for syncope, weakness, light-headedness, numbness and headaches.    Physical Exam Updated Vital Signs BP (!) 136/91 (BP Location: Left Arm)   Pulse 98   Temp 97.6 F (36.4 C) (Oral)   Resp 18   Ht 5\' 3"  (1.6 m)   Wt 97.5 kg   SpO2 98%   BMI 38.09 kg/m   Physical Exam Vitals and nursing note reviewed.  Constitutional:      General: He is not in acute distress.    Appearance: He is not ill-appearing, toxic-appearing or diaphoretic.  HENT:     Head: Normocephalic and atraumatic.  Eyes:     General: No scleral icterus.       Right eye: No discharge.        Left eye: No discharge.  Cardiovascular:     Rate and Rhythm: Normal rate.  Pulmonary:     Effort: Pulmonary effort is normal. No respiratory distress.     Breath sounds: No stridor.  Musculoskeletal:  General: Tenderness present.     Right shoulder: No swelling, deformity, effusion, laceration, tenderness, bony tenderness or crepitus. Normal range of motion. Normal strength.     Left shoulder: No swelling, deformity, effusion, laceration, tenderness, bony tenderness or crepitus. Normal range of motion. Normal strength.     Right upper arm: Normal.     Left upper arm: Normal.     Right elbow: Normal.     Left elbow: Normal.     Right forearm: Normal.     Left forearm: Normal.     Right wrist: No swelling, deformity, effusion, lacerations, tenderness, bony tenderness, snuff box tenderness or crepitus. Normal range of motion. Normal pulse.     Left wrist: No swelling, deformity, effusion, lacerations, tenderness, bony tenderness, snuff box tenderness or  crepitus. Normal range of motion. Normal pulse.     Right hand: No swelling, deformity, tenderness or bony tenderness. Normal range of motion. Normal strength. Normal sensation. Normal capillary refill.     Left hand: No swelling, deformity, tenderness or bony tenderness. Normal range of motion. Normal strength. Normal sensation. Normal capillary refill.     Cervical back: No deformity, rigidity, torticollis, tenderness or bony tenderness. No pain with movement, spinous process tenderness or muscular tenderness.     Thoracic back: No deformity, tenderness or bony tenderness.     Lumbar back: No deformity, tenderness or bony tenderness.     Comments: Patient has tenderness to right trapezius muscle  Skin:    General: Skin is warm and dry.  Neurological:     General: No focal deficit present.     Mental Status: He is alert.  Psychiatric:        Behavior: Behavior is cooperative.     ED Results / Procedures / Treatments   Labs (all labs ordered are listed, but only abnormal results are displayed) Labs Reviewed - No data to display  EKG None  Radiology No results found.  Procedures Procedures   Medications Ordered in ED Medications - No data to display  ED Course  I have reviewed the triage vital signs and the nursing notes.  Pertinent labs & imaging results that were available during my care of the patient were reviewed by me and considered in my medical decision making (see chart for details).    MDM Rules/Calculators/A&P                          Alert 35 year old male no acute distress, nontoxic appearing.  Patient presents with chief complaint of pain near his right shoulder after working out.  Patient denies any numbness or tingling to his extremities, weakness to his extremities.  Patient's pain began after bench pressing.    Patient has +5 strength and full range of motion to shoulders bilaterally.  She has no cervical spinous process tenderness.  Complains of  tenderness to his right trapezius muscle.  Patient's pain is likely musculoskeletal nature as there is no bony tenderness and pain began after working out.  Will have patient use over-the-counter pain medications to manage his symptoms.  Patient advised to use ice and/or heat to help manage his symptoms as well.  Patient to follow-up with primary care provider as needed if symptoms do not improve.  Patient was given return precautions.  Patient expressed understanding of all instructions.   Final Clinical Impression(s) / ED Diagnoses Final diagnoses:  Strain of right trapezius muscle, initial encounter    Rx / DC Orders ED  Discharge Orders    None       Berneice Heinrich 12/29/20 2104    Little, Ambrose Finland, MD 01/01/21 [phone]

## 2020-12-29 NOTE — Discharge Instructions (Addendum)
You came to the emergency department today to be evaluated for pain near your shoulder.  Your physical exam was reassuring.  Your injury is likely musculoskeletal in nature.  You may use over-the-counter pain medication as needed to treat your symptoms.  You may also try using ice and/or heat to help with your symptoms.  Please refrain from working out until your symptoms improve.  Get help right away if: You have numbness or tingling or lose a lot of strength in the injured area.

## 2021-07-21 IMAGING — CT CT ABD-PELV W/ CM
2 of 4 series · 17 of 46 positions shown, 19 images · IV contrast (Omnipaque)
Comparison: None.

CLINICAL DATA: Right-sided abdominal and back pain for 2 days.

EXAM:
CT ABDOMEN AND PELVIS WITH CONTRAST
TECHNIQUE: Multidetector CT imaging of the abdomen and pelvis was performed
using the standard protocol following bolus administration of
intravenous contrast.
CONTRAST:  100mL OMNIPAQUE IOHEXOL 300 MG/ML  SOLN

[Series 2: axial st · axial · 0.92mm/px · z∈[-441,-21]mm · 14 of 92 slices shown, 16 images]
[im 4/92  soft-tissue]
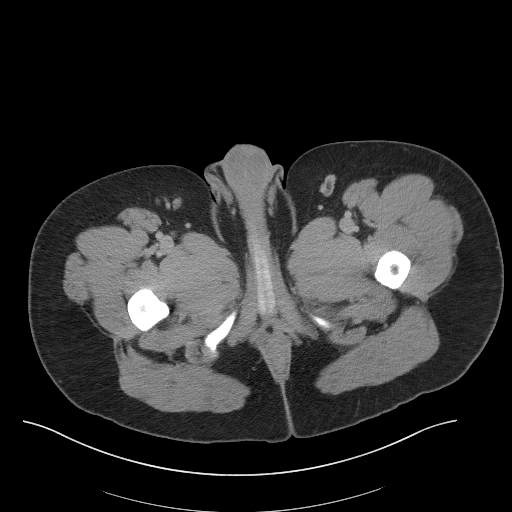
[im 4/92  bone]
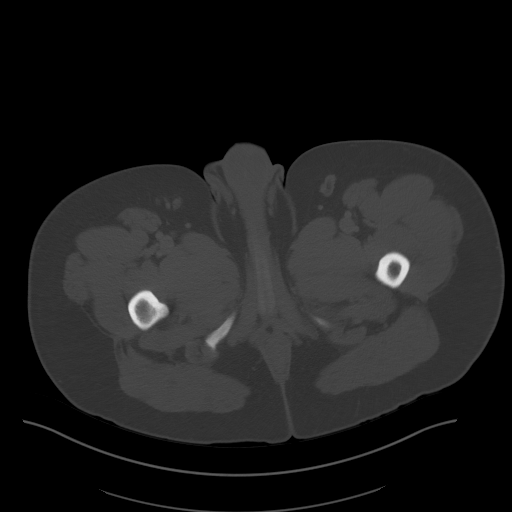
[im 12/92  soft-tissue]
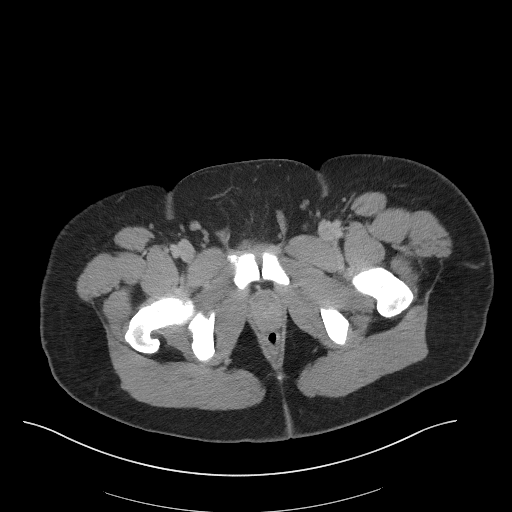
[im 19/92  soft-tissue]
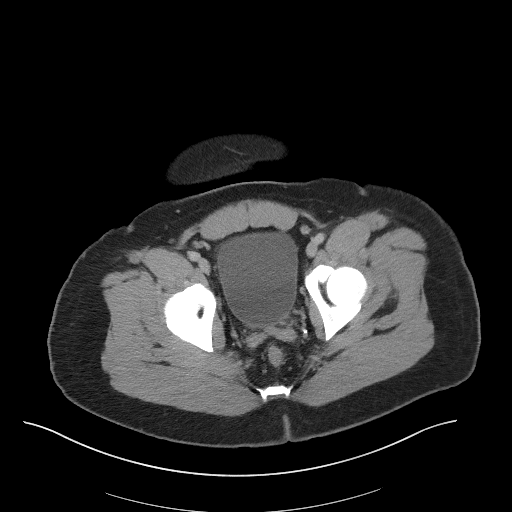
[im 23/92  soft-tissue]
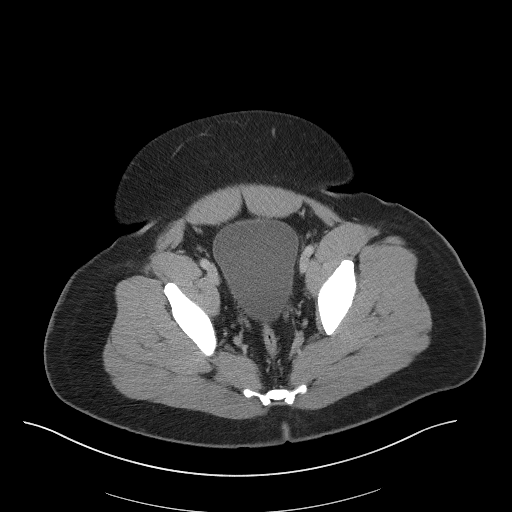
[im 31/92  soft-tissue]
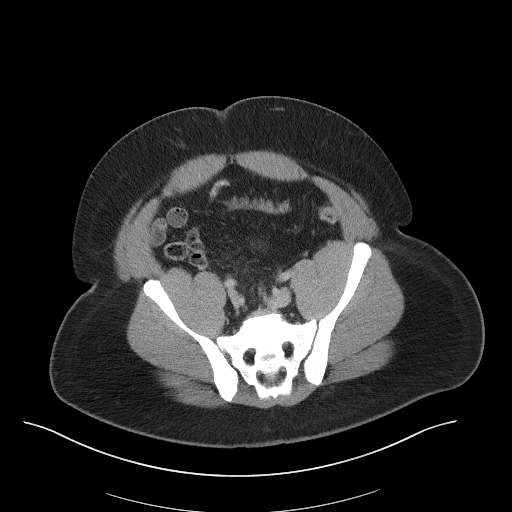
[im 38/92  soft-tissue]
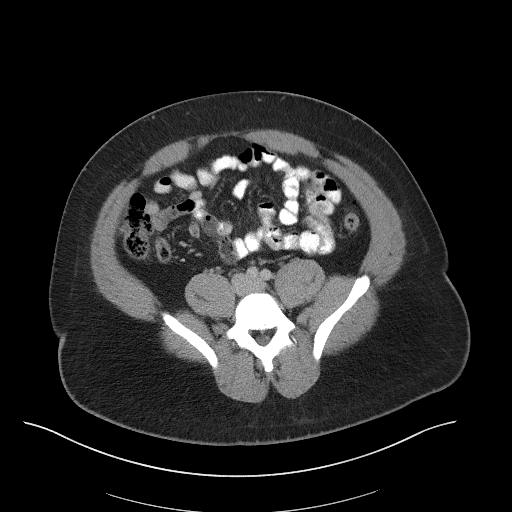
[im 42/92  soft-tissue]
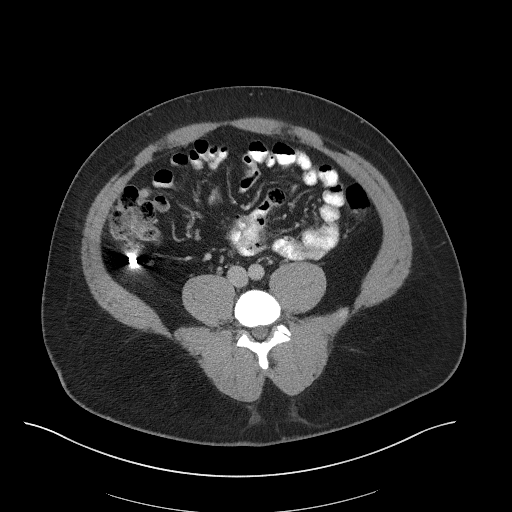
[im 50/92  soft-tissue]
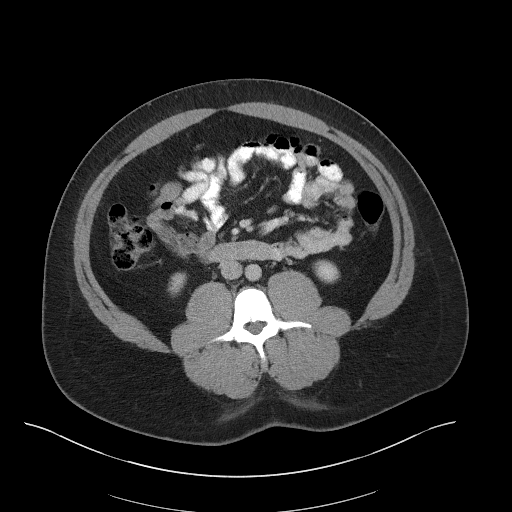
[im 54/92  soft-tissue]
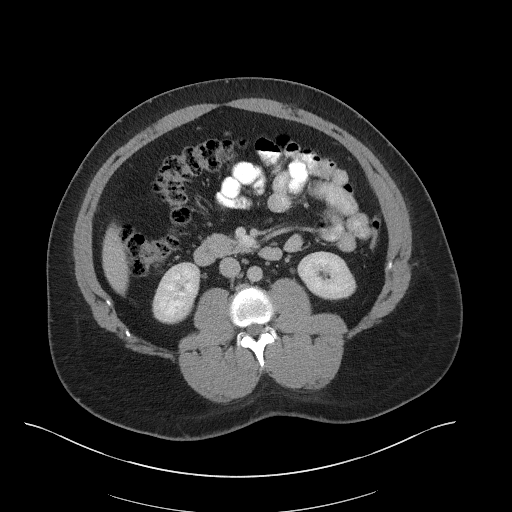
[im 54/92  bone]
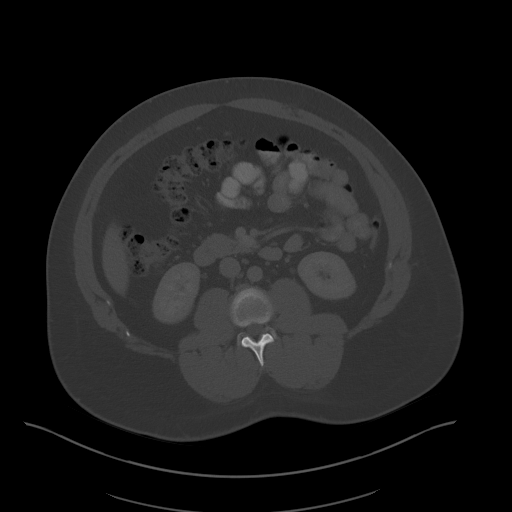
[im 61/92  soft-tissue]
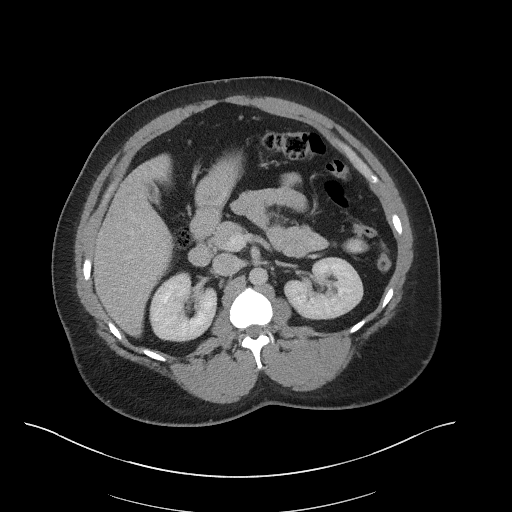
[im 69/92  soft-tissue]
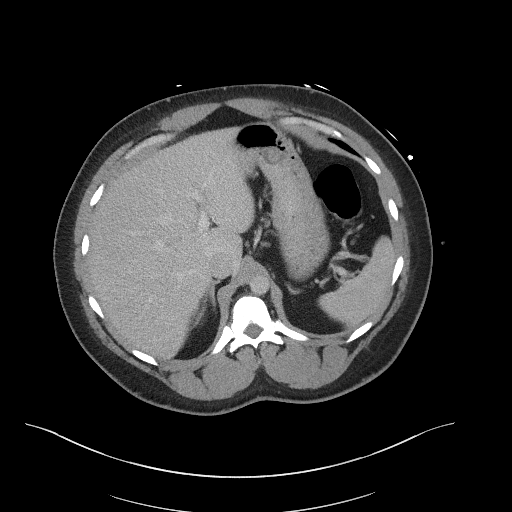
[im 73/92  soft-tissue]
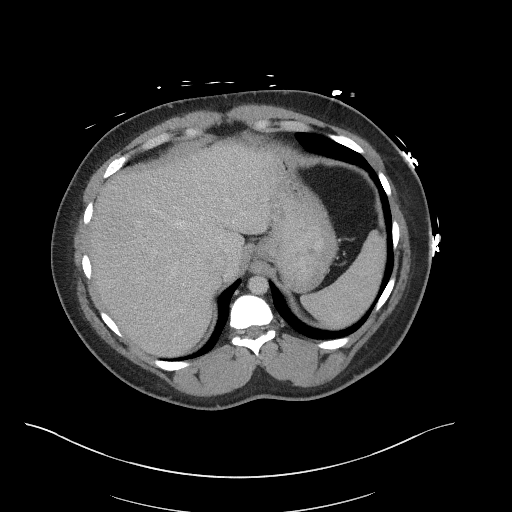
[im 80/92  soft-tissue]
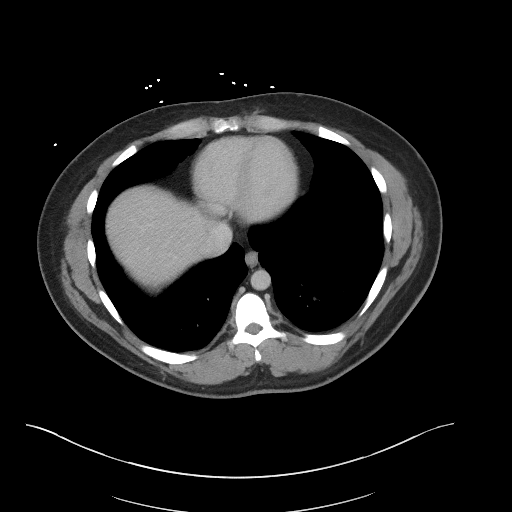
[im 88/92  soft-tissue]
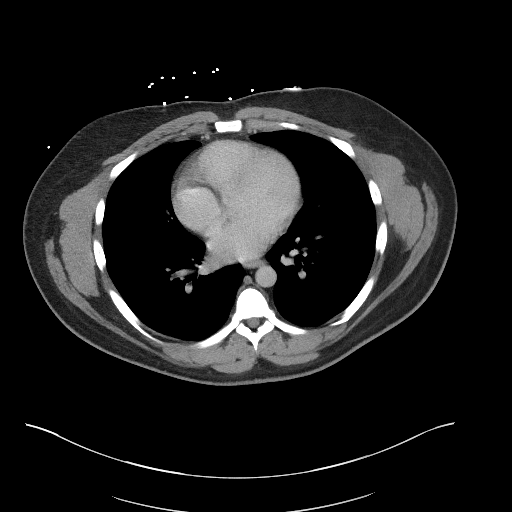

[Series 5: coronal st · coronal · 0.83mm/px · 3 of 100 slices shown]
[im 34/100  soft-tissue]
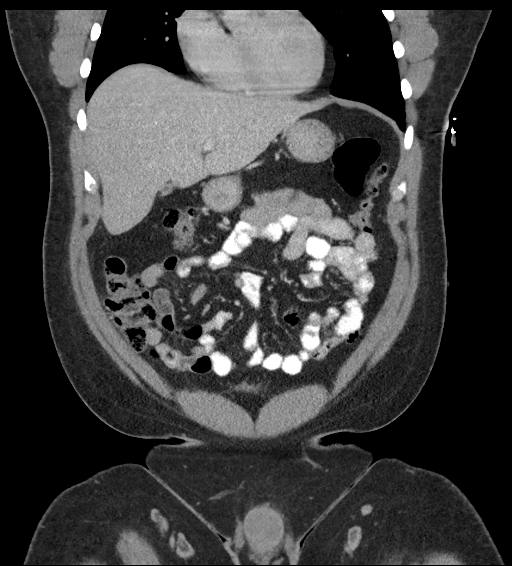
[im 45/100  soft-tissue]
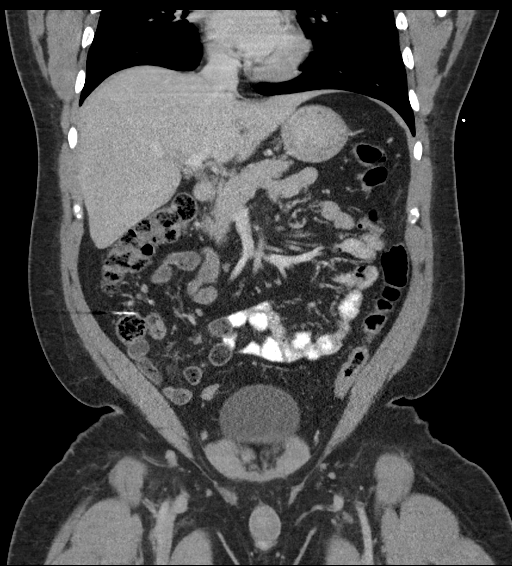
[im 56/100  soft-tissue]
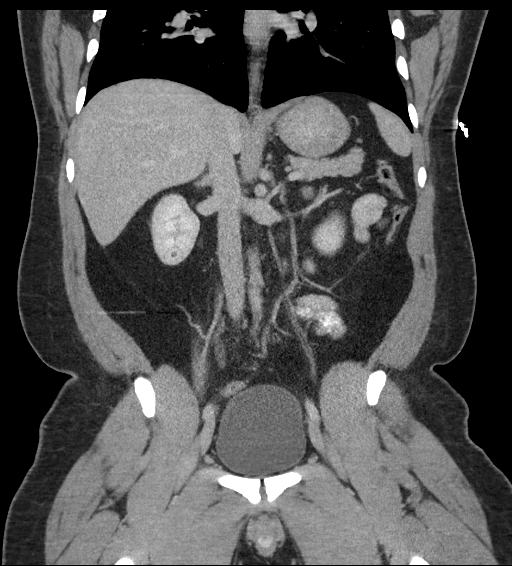

[17 of 46 positions shown; findings below may reference images not displayed]

FINDINGS: Lower chest:  No contributory findings.

Hepatobiliary: No focal liver abnormality.No evidence of biliary
obstruction or stone.

Pancreas: Unremarkable.

Spleen: Unremarkable.

Adrenals/Urinary Tract: Negative adrenals. No hydronephrosis or
stone. Unremarkable bladder.

Stomach/Bowel: No obstruction. No evidence of bowel inflammation.
Appendectomy.

Vascular/Lymphatic: No acute vascular abnormality. No mass or
adenopathy.

Reproductive:No pathologic findings.

Other: No ascites or pneumoperitoneum.

Musculoskeletal: No acute abnormalities.
IMPRESSION: Negative abdominal CT.  No explanation for abdominal pain.

## 2021-07-28 ENCOUNTER — Emergency Department (HOSPITAL_BASED_OUTPATIENT_CLINIC_OR_DEPARTMENT_OTHER)
Admission: EM | Admit: 2021-07-28 | Discharge: 2021-07-28 | Disposition: A | Discharge status: Home / Self Care | Payer: Medicaid Other | Attending: Emergency Medicine | Admitting: Emergency Medicine

## 2021-07-28 ENCOUNTER — Other Ambulatory Visit: Payer: Self-pay

## 2021-07-28 ENCOUNTER — Encounter (HOSPITAL_BASED_OUTPATIENT_CLINIC_OR_DEPARTMENT_OTHER): Payer: Self-pay | Admitting: Pharmacy Technician

## 2021-07-28 DIAGNOSIS — R509 Fever, unspecified: Secondary | ICD-10-CM

## 2021-07-28 DIAGNOSIS — F17210 Nicotine dependence, cigarettes, uncomplicated: Secondary | ICD-10-CM | POA: Diagnosis not present

## 2021-07-28 DIAGNOSIS — U071 COVID-19: Secondary | ICD-10-CM | POA: Insufficient documentation

## 2021-07-28 DIAGNOSIS — Z20822 Contact with and (suspected) exposure to covid-19: Secondary | ICD-10-CM

## 2021-07-28 DIAGNOSIS — R000 Tachycardia, unspecified: Secondary | ICD-10-CM | POA: Insufficient documentation

## 2021-07-28 MED ORDER — KETOROLAC TROMETHAMINE 30 MG/ML IJ SOLN
30.0000 mg | Freq: Once | INTRAMUSCULAR | Status: AC
  Administered 2021-07-28: 30 mg via INTRAMUSCULAR
  Filled 2021-07-28: qty 1

## 2021-07-28 MED ORDER — ACETAMINOPHEN 325 MG PO TABS
650.0000 mg | ORAL_TABLET | Freq: Once | ORAL | Status: AC | PRN
  Administered 2021-07-28: 650 mg via ORAL
  Filled 2021-07-28: qty 2

## 2021-07-28 NOTE — ED Provider Notes (Signed)
[facility] EMERGENCY DEPARTMENT Provider Note   CSN: [id number] Arrival date & time: 07/28/21  1534     History Chief Complaint  Patient presents with   Joint Pain   Headache   Covid Exposure    Joe Barrett is a 35 y.o. male.  Patient presents the emergency department today for evaluation of flulike illness.  Patient developed fever, 102.5 F on arrival, with body aches worse in the lower back and legs.  Also with headache.  No significant cough or shortness of breath.  No nausea, vomiting, or diarrhea.  Patient states that his son is sick with COVID and he has had a close exposure.  He has received COVID-vaccine and booster shot.  No urinary symptoms.  No treatments prior to arrival. Patient denies neuro sx including: facial droop, slurred speech, aphasia, weakness/numbness in extremities, imbalance/trouble walking. The onset of this condition was acute. The course is constant. Aggravating factors: none. Alleviating factors: none.        Past Medical History:  Diagnosis Date   Paranoid schizophrenia [facility])     Patient Active Problem List   Diagnosis Date Noted   Knee effusion, right 05/24/2020    Past Surgical History:  Procedure Laterality Date   APPENDECTOMY         No family history on file.  Social History   Tobacco Use   Smoking status: Every Day    Packs/day: 0.50    Types: Cigarettes   Smokeless tobacco: Never  Vaping Use   Vaping Use: Never used  Substance Use Topics   Alcohol use: Yes    Comment: occ   Drug use: Never    Home Medications Prior to Admission medications   Medication Sig Start Date End Date Taking? Authorizing Provider  fluPHENAZine (PROLIXIN) 2.5 MG tablet TK 1 T PO QAM AND 2 TS QHS. DO NOT DRIVE OR OPERATE MACHINERY. DO NOT USE ALCOHOL 02/08/18   [provider]    Allergies    Patient has no known allergies.  Review of Systems   Review of Systems  Constitutional:  Positive for chills and fever.  Negative for fatigue.  HENT:  Negative for congestion, ear pain, rhinorrhea, sinus pressure and sore throat.   Eyes:  Negative for redness.  Respiratory:  Negative for cough and wheezing.   Gastrointestinal:  Negative for abdominal pain, diarrhea, nausea and vomiting.  Genitourinary:  Negative for dysuria.  Musculoskeletal:  Positive for myalgias. Negative for neck stiffness.  Skin:  Negative for rash.  Neurological:  Positive for headaches.  Hematological:  Negative for adenopathy.   Physical Exam Updated Vital Signs BP 112/77 (BP Location: Left Arm)   Pulse (!) 112   Temp (!) 102.5 F (39.2 C) (Oral)   Resp 18   SpO2 95%   Physical Exam Vitals and nursing note reviewed.  Constitutional:      Appearance: He is well-developed.  HENT:     Head: Normocephalic and atraumatic.     Jaw: No trismus.     Right Ear: Tympanic membrane, ear canal and external ear normal.     Left Ear: Tympanic membrane, ear canal and external ear normal.     Nose: Nose normal. No mucosal edema or rhinorrhea.     Mouth/Throat:     Mouth: Mucous membranes are not dry.     Pharynx: Uvula midline. No oropharyngeal exudate, posterior oropharyngeal erythema or uvula swelling.     Tonsils: No tonsillar abscesses.  Eyes:  General:        Right eye: No discharge.        Left eye: No discharge.     Conjunctiva/sclera: Conjunctivae normal.  Cardiovascular:     Rate and Rhythm: Regular rhythm. Tachycardia present.     Heart sounds: Normal heart sounds.  Pulmonary:     Effort: Pulmonary effort is normal. No respiratory distress.     Breath sounds: Normal breath sounds. No wheezing or rales.  Abdominal:     Palpations: Abdomen is soft.     Tenderness: There is no abdominal tenderness.  Musculoskeletal:     Cervical back: Normal range of motion and neck supple.  Skin:    General: Skin is warm and dry.  Neurological:     Mental Status: He is alert.    ED Results / Procedures / Treatments    Labs (all labs ordered are listed, but only abnormal results are displayed) Labs Reviewed - No data to display  EKG None  Radiology No results found.  Procedures Procedures   Medications Ordered in ED Medications  ketorolac (TORADOL) 30 MG/ML injection 30 mg (has no administration in time range)  acetaminophen (TYLENOL) tablet 650 mg (650 mg Oral Given 07/28/21 1636)    ED Course  I have reviewed the triage vital signs and the nursing notes.  Pertinent labs & imaging results that were available during my care of the patient were reviewed by me and considered in my medical decision making (see chart for details).  Patient seen and examined. Work-up initiated. Medications ordered.  Patient states that he is ready to go home so that he can lie down.  Vital signs reviewed and are as follows: BP 112/77 (BP Location: Left Arm)   Pulse (!) 112   Temp (!) 102.5 F (39.2 C) (Oral)   Resp 18   SpO2 95%   Detailed discussion had with with patient regarding COVID-19 precautions and written instructions given as well.  We discussed need to isolate themselves for 5 days from onset of symptoms and have 24 hours of improvement prior to breaking isolation.  We discussed that when breaking isolation, mask wearing for 5 additional days is required.  We discussed signs symptoms to return which include worsening shortness of breath, trouble breathing, or increased work of breathing.  Also return with persistent vomiting, confusion, passing out, or if they have any other concerns. Counseled on the need for rest and good hydration. Discussed that high-risk contacts should be aware of positive result and they need to quarantine and be tested if they develop any symptoms. Patient verbalizes understanding.   [name] was evaluated in Emergency Department on 07/28/2021 for the symptoms described in the history of present illness. He was evaluated in the context of the global COVID-19 pandemic,  which necessitated consideration that the patient might be at risk for infection with the SARS-CoV-2 virus that causes COVID-19. Institutional protocols and algorithms that pertain to the evaluation of patients at risk for COVID-19 are in a state of rapid change based on information released by regulatory bodies including the CDC and federal and state organizations. These policies and algorithms were followed during the patient's care in the ED.     MDM Rules/Calculators/A&P                           Well-appearing patient with fever, symptoms concerning for COVID.  Will test and treat accordingly.  No indications for antibiotics at  this point.  Final Clinical Impression(s) / ED Diagnoses Final diagnoses:  Febrile illness  Encounter for laboratory testing for COVID-19 virus    Rx / DC Orders ED Discharge Orders     None        Renne Crigler, PA-C 07/28/21 1728    Tegeler, Canary Brim, MD 07/28/21 2349

## 2021-07-28 NOTE — ED Notes (Signed)
Pt taken to fast track room, on arrival pt request to be placed in pt room with bed "I am so sick, I need a room with a bed so I can lay down" Pt informed no pt room available at this time, that he will be called when one comes available for him.  Pt agreeable to this plan.

## 2021-07-28 NOTE — Discharge Instructions (Signed)
Please read and follow all provided instructions.  Your diagnoses today include:  1. Febrile illness   2. Encounter for laboratory testing for COVID-19 virus     Tests performed today include: Vital signs. See below for your results today.  COVID test - pending, check mychart for results  Medications prescribed:  Please use over-the-counter NSAID medications (ibuprofen, naproxen) as directed on the packaging for pain if you do not have any reasons not to take these medications just as weak kidneys or a history of bleeding in your stomach or gut.   Take any prescribed medications only as directed. Treatment for your infection is aimed at treating the symptoms. There are no medications, such as antibiotics, that will cure your infection.   Home care instructions:  Follow any educational materials contained in this packet.   Your illness is contagious and can be spread to others, especially during the first 3 or 4 days. It cannot be cured by antibiotics or other medicines. Take basic precautions such as washing your hands often, covering your mouth when you cough or sneeze, and avoiding public places where you could spread your illness to others.   Please continue drinking plenty of fluids.  Use over-the-counter medicines as needed as directed on packaging for symptom relief.  You may also use ibuprofen or tylenol as directed on packaging for pain or fever.  Do not take multiple medicines containing Tylenol or acetaminophen to avoid taking too much of this medication.  If you are positive for Covid-19, you should isolate yourself and not be exposed to other people for 5 days after your symptoms began. If you are not feeling better at day 5, you need to isolate yourself for a total of 10 days. If you are feeling better by day 5, you should wear a mask properly, over your nose and mouth, at all times while around other people until 10 days after your symptoms started.   Follow-up  instructions: Please follow-up with your primary care provider as needed for further evaluation of your symptoms if you are not feeling better.   Return instructions:  Please return to the Emergency Department if you experience worsening symptoms.  Return to the emergency department if you have worsening shortness of breath breathing or increased work of breathing, persistent vomiting RETURN IMMEDIATELY IF you develop shortness of breath, confusion or altered mental status, a new rash, become dizzy, faint, or poorly responsive, or are unable to be cared for at home. Please return if you have persistent vomiting and cannot keep down fluids or develop a fever that is not controlled by tylenol or motrin.   Please return if you have any other emergent concerns.  Additional Information:  Your vital signs today were: BP 112/77 (BP Location: Left Arm)   Pulse (!) 112   Temp (!) 102.5 F (39.2 C) (Oral)   Resp 18   SpO2 95%  If your blood pressure (BP) was elevated above 135/85 this visit, please have this repeated by your doctor within one month. --------------

## 2021-07-28 NOTE — ED Triage Notes (Signed)
Pt here with complaints of joint pain, headache and back ache along with fevers. Pt states son is covid+.

## 2021-07-28 NOTE — ED Notes (Signed)
D/c paperwork reviewed with pt including sx management. Pt verbalized understanding, ambulatory to ED exit.

## 2021-07-29 LAB — SARS CORONAVIRUS 2 (TAT 6-24 HRS): SARS Coronavirus 2: POSITIVE — AB

## 2021-10-01 IMAGING — CR DG KNEE COMPLETE 4+V*R*
4 series · 4 of 4 positions shown · non-contrast
Comparison: None.

CLINICAL DATA: Right knee pain

EXAM:
RIGHT KNEE - COMPLETE 4+ VIEW

[t knee ap right]
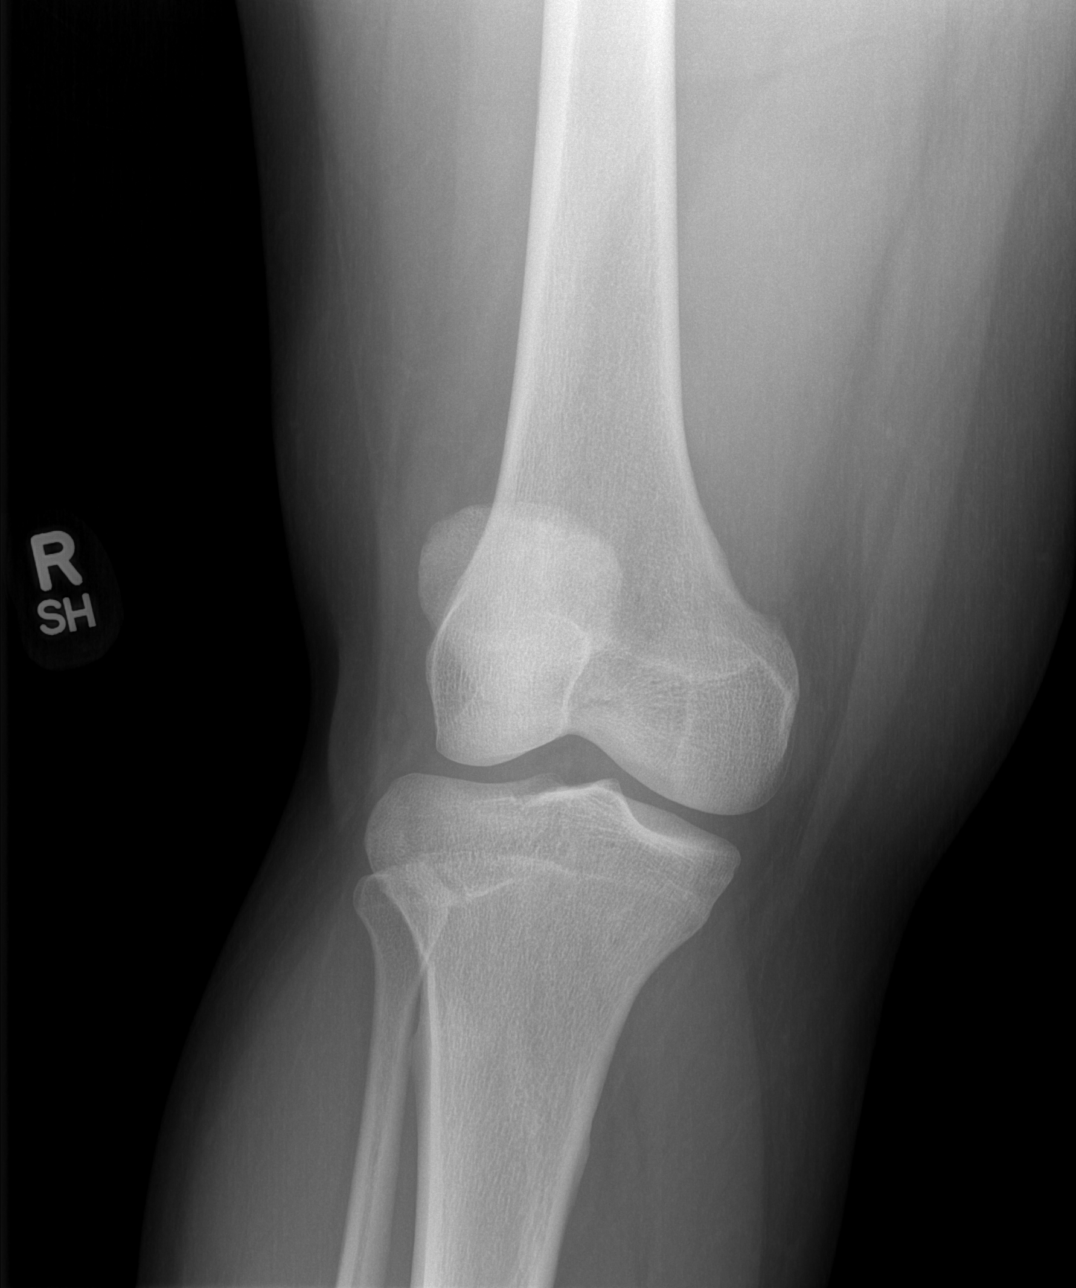

[t knee oblique right (1 of 2)]
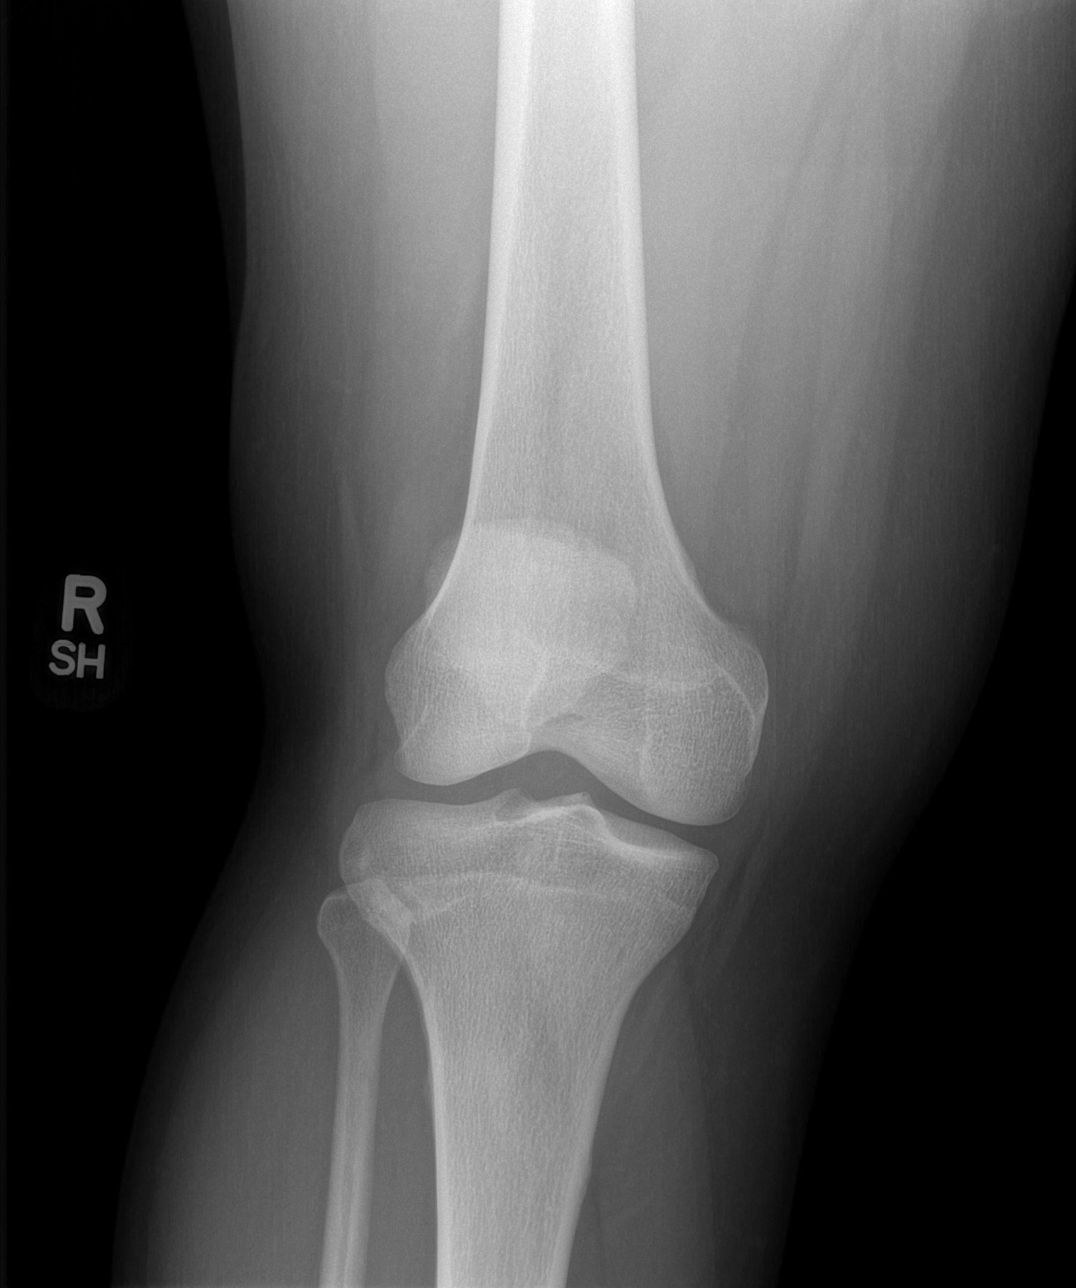

[t knee oblique right (2 of 2)]
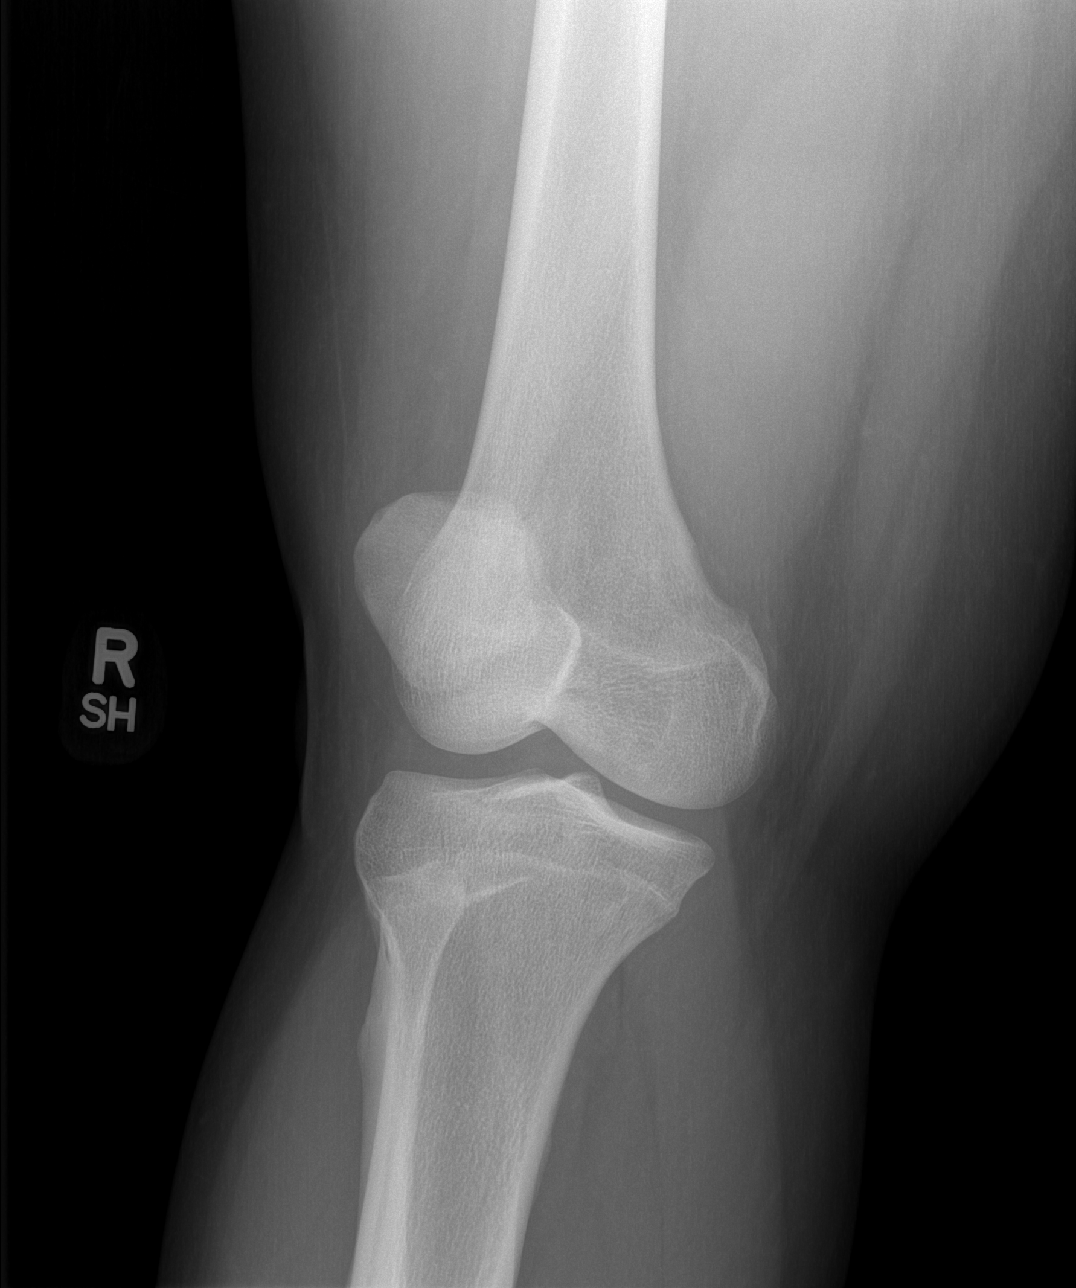

[t knee lat right]
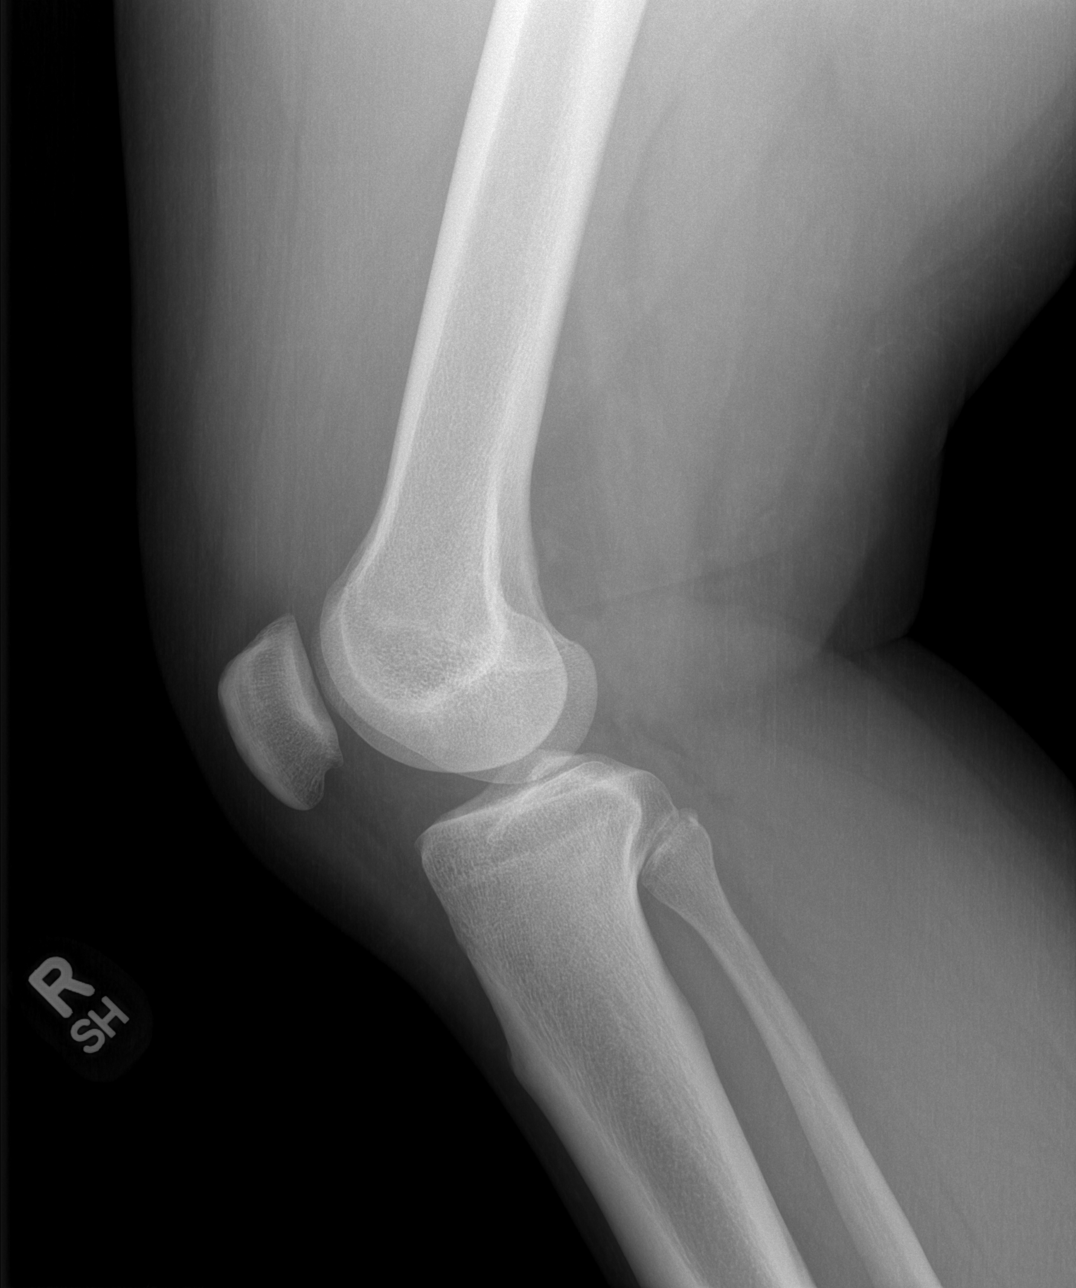

[4 of 4 positions shown; findings below may reference images not displayed]

FINDINGS: No evidence of fracture, dislocation, or joint effusion. No evidence
of arthropathy or other focal bone abnormality. Soft tissues are
unremarkable.
IMPRESSION: Negative.

## 2021-12-30 ENCOUNTER — Encounter (HOSPITAL_BASED_OUTPATIENT_CLINIC_OR_DEPARTMENT_OTHER): Payer: Self-pay | Admitting: Emergency Medicine

## 2021-12-30 ENCOUNTER — Emergency Department (HOSPITAL_BASED_OUTPATIENT_CLINIC_OR_DEPARTMENT_OTHER)
Admission: EM | Admit: 2021-12-30 | Discharge: 2021-12-30 | Disposition: A | Discharge status: Home / Self Care | Payer: Medicaid Other | Attending: Emergency Medicine | Admitting: Emergency Medicine

## 2021-12-30 ENCOUNTER — Other Ambulatory Visit: Payer: Self-pay

## 2021-12-30 DIAGNOSIS — R200 Anesthesia of skin: Secondary | ICD-10-CM | POA: Diagnosis not present

## 2021-12-30 DIAGNOSIS — R202 Paresthesia of skin: Secondary | ICD-10-CM | POA: Insufficient documentation

## 2021-12-30 MED ORDER — ACETAMINOPHEN 500 MG PO TABS
1000.0000 mg | ORAL_TABLET | Freq: Once | ORAL | Status: AC
  Filled 2021-12-30: qty 2

## 2021-12-30 MED ORDER — ACETAMINOPHEN 500 MG PO TABS
ORAL_TABLET | ORAL | Status: AC
  Administered 2021-12-30: 1000 mg via ORAL
  Filled 2021-12-30: qty 1

## 2021-12-30 NOTE — ED Provider Notes (Signed)
[facility] EMERGENCY DEPARTMENT Provider Note   CSN: [id number] Arrival date & time: 12/30/21  0007     History { Chief Complaint  Patient presents with   Finger numbness    Joe Barrett is a 36 y.o. male.  The history is provided by the patient.  Illness Location:  Tingling tip of the R middle finger Quality:  Tigling Severity:  Moderate Duration:  17 hours Timing:  Constant Progression:  Unchanged Chronicity:  New Context:  Awoke with tingling of a spot at the tip of the right miffle finger Relieved by:  Nothing Worsened by:  Nothing Ineffective treatments:  None Associated symptoms: no abdominal pain, no chest pain, no congestion, no cough, no diarrhea, no ear pain, no fatigue, no fever, no headaches, no loss of consciousness, no myalgias, no nausea, no rash, no rhinorrhea, no shortness of breath, no sore throat, no vomiting and no wheezing   Patient with Schizophrenia with spot of tingling of tip of R middle finger.  No weakness no changes in vision or speech no trauma.  Works with Psychologist, prison and probation services.      Home Medications Prior to Admission medications   Medication Sig Start Date End Date Taking? Authorizing Provider  fluPHENAZine (PROLIXIN) 2.5 MG tablet TK 1 T PO QAM AND 2 TS QHS. DO NOT DRIVE OR OPERATE MACHINERY. DO NOT USE ALCOHOL 02/08/18   [provider]      Allergies    Patient has no known allergies.    Review of Systems   Review of Systems  Constitutional:  Negative for fatigue and fever.  HENT:  Negative for congestion, ear pain, rhinorrhea and sore throat.   Eyes:  Negative for redness and visual disturbance.  Respiratory:  Negative for cough, shortness of breath and wheezing.   Cardiovascular:  Negative for chest pain.  Gastrointestinal:  Negative for abdominal pain, diarrhea, nausea and vomiting.  Genitourinary:  Negative for difficulty urinating.  Musculoskeletal:  Negative for myalgias.  Skin:  Negative for rash.   Neurological:  Negative for dizziness, tremors, seizures, loss of consciousness, syncope, facial asymmetry, speech difficulty, weakness and headaches.  Psychiatric/Behavioral:  Negative for agitation.   All other systems reviewed and are negative.  Physical Exam Updated Vital Signs BP 123/89 (BP Location: Right Arm)    Pulse 90    Temp 98.1 F (36.7 C) (Oral)    Resp 18    Ht 5\' 3"  (1.6 m)    Wt 95.3 kg    SpO2 100%    BMI 37.20 kg/m  Physical Exam Vitals and nursing note reviewed.  Constitutional:      General: He is not in acute distress.    Appearance: Normal appearance.  HENT:     Head: Normocephalic and atraumatic.     Nose: Nose normal.  Eyes:     Extraocular Movements: Extraocular movements intact.     Conjunctiva/sclera: Conjunctivae normal.     Pupils: Pupils are equal, round, and reactive to light.  Cardiovascular:     Rate and Rhythm: Normal rate and regular rhythm.     Pulses: Normal pulses.     Heart sounds: Normal heart sounds.  Pulmonary:     Effort: Pulmonary effort is normal.  Musculoskeletal:        General: Normal range of motion.     Cervical back: Normal range of motion and neck supple.  Skin:    General: Skin is warm and dry.     Capillary Refill: Capillary refill takes  less than 2 seconds.  Neurological:     General: No focal deficit present.     Mental Status: He is alert and oriented to person, place, and time.     Cranial Nerves: No cranial nerve deficit.     Sensory: No sensory deficit.     Motor: No weakness.     Gait: Gait normal.     Deep Tendon Reflexes: Reflexes normal.     Comments: Sensory is intact to all nerve distributions of the extremities.  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        Behavior: Behavior normal.    ED Results / Procedures / Treatments   Labs (all labs ordered are listed, but only abnormal results are displayed) Labs Reviewed - No  data to display  EKG None  Radiology No results found.  Procedures Procedures    Medications Ordered in ED Medications - No data to display  ED Course/ Medical Decision Making/ A&P                           Medical Decision Making Tingling of spot of the tip of the right middle finger.    Problems Addressed: Paresthesia: acute illness or injury  Risk OTC drugs. Risk Details: The patient is actually hyperesthetic on direct sensory testing.  No numbness in any nerve distribution.  No weakness.  Cn 2-12 intact.  I have advised close follow up for ongoing testing.  I cannot explain even with vibrating equipment why it would be a small spot at the tip of one finger only.  The patient is not having a stroke and I do not believe this is a demyelinating illness.      Final Clinical Impression(s) / ED Diagnoses Final diagnoses:  None   Return for intractable cough, coughing up blood, fevers > 100.4 unrelieved by medication, shortness of breath, intractable vomiting, chest pain, shortness of breath, weakness, numbness, changes in speech, facial asymmetry, abdominal pain, passing out, Inability to tolerate liquids or food, cough, altered mental status or any concerns. No signs of systemic illness or infection. The patient is nontoxic-appearing on exam and vital signs are within normal limits.  I have reviewed the triage vital signs and the nursing notes. Pertinent labs & imaging results that were available during my care of the patient were reviewed by me and considered in my medical decision making (see chart for details). After history, exam, and medical workup I feel the patient has been appropriately medically screened and is safe for discharge home. Pertinent diagnoses were discussed with the patient. Patient was given return precautions. Rx / DC Orders ED Discharge Orders     None         Shakeria Robinette, MD 12/30/21 2482

## 2021-12-30 NOTE — ED Triage Notes (Signed)
Patient arrived via POV c/o finger numbness to right middle finger tip. Patient states started when he woke up at 0800. Patient denies injury. Patient is AO x 4, VS WDL, normal gait.

## 2021-12-30 NOTE — ED Notes (Signed)
Patient discharged to home.  All discharge instructions reviewed.  Patient verbalized understanding via teachback method.  VS WDL.  Respirations even and unlabored.  Ambulatory out of ED.   °

## 2022-01-11 ENCOUNTER — Other Ambulatory Visit: Payer: Self-pay

## 2022-01-11 ENCOUNTER — Emergency Department (HOSPITAL_BASED_OUTPATIENT_CLINIC_OR_DEPARTMENT_OTHER)
Admission: EM | Admit: 2022-01-11 | Discharge: 2022-01-11 | Disposition: A | Discharge status: Home / Self Care | Payer: Medicaid Other | Attending: Emergency Medicine | Admitting: Emergency Medicine

## 2022-01-11 ENCOUNTER — Emergency Department (HOSPITAL_BASED_OUTPATIENT_CLINIC_OR_DEPARTMENT_OTHER)
Admission: EM | Admit: 2022-01-11 | Discharge: 2022-01-12 | Disposition: A | Discharge status: Home / Self Care | Payer: Medicaid Other | Source: Home / Self Care | Attending: Emergency Medicine | Admitting: Emergency Medicine

## 2022-01-11 ENCOUNTER — Encounter (HOSPITAL_BASED_OUTPATIENT_CLINIC_OR_DEPARTMENT_OTHER): Payer: Self-pay | Admitting: Urology

## 2022-01-11 ENCOUNTER — Encounter (HOSPITAL_BASED_OUTPATIENT_CLINIC_OR_DEPARTMENT_OTHER): Payer: Self-pay | Admitting: Emergency Medicine

## 2022-01-11 DIAGNOSIS — R197 Diarrhea, unspecified: Secondary | ICD-10-CM | POA: Insufficient documentation

## 2022-01-11 DIAGNOSIS — R109 Unspecified abdominal pain: Secondary | ICD-10-CM | POA: Diagnosis not present

## 2022-01-11 DIAGNOSIS — Z5321 Procedure and treatment not carried out due to patient leaving prior to being seen by health care provider: Secondary | ICD-10-CM | POA: Insufficient documentation

## 2022-01-11 LAB — CBC WITH DIFFERENTIAL/PLATELET
Abs Immature Granulocytes: 0.03 10*3/uL (ref 0.00–0.07)
Basophils Absolute: 0 10*3/uL (ref 0.0–0.1)
Basophils Relative: 0 %
Eosinophils Absolute: 0.1 10*3/uL (ref 0.0–0.5)
Eosinophils Relative: 1 %
HCT: 44 % (ref 39.0–52.0)
Hemoglobin: 14.1 g/dL (ref 13.0–17.0)
Immature Granulocytes: 0 %
Lymphocytes Relative: 31 %
Lymphs Abs: 3.3 10*3/uL (ref 0.7–4.0)
MCH: 25.8 pg — ABNORMAL LOW (ref 26.0–34.0)
MCHC: 32 g/dL (ref 30.0–36.0)
MCV: 80.6 fL (ref 80.0–100.0)
Monocytes Absolute: 1.3 10*3/uL — ABNORMAL HIGH (ref 0.1–1.0)
Monocytes Relative: 12 %
Neutro Abs: 6.1 10*3/uL (ref 1.7–7.7)
Neutrophils Relative %: 56 %
Platelets: 279 10*3/uL (ref 150–400)
RBC: 5.46 MIL/uL (ref 4.22–5.81)
RDW: 14.6 % (ref 11.5–15.5)
WBC: 10.8 10*3/uL — ABNORMAL HIGH (ref 4.0–10.5)
nRBC: 0 % (ref 0.0–0.2)

## 2022-01-11 LAB — URINALYSIS, ROUTINE W REFLEX MICROSCOPIC
Bilirubin Urine: NEGATIVE
Glucose, UA: NEGATIVE mg/dL
Ketones, ur: NEGATIVE mg/dL
Leukocytes,Ua: NEGATIVE
Nitrite: NEGATIVE
Protein, ur: NEGATIVE mg/dL
Specific Gravity, Urine: 1.025 (ref 1.005–1.030)
pH: 6 (ref 5.0–8.0)

## 2022-01-11 LAB — BASIC METABOLIC PANEL
Anion gap: 9 (ref 5–15)
BUN: 13 mg/dL (ref 6–20)
CO2: 25 mmol/L (ref 22–32)
Calcium: 9.3 mg/dL (ref 8.9–10.3)
Chloride: 104 mmol/L (ref 98–111)
Creatinine, Ser: 1.12 mg/dL (ref 0.61–1.24)
GFR, Estimated: >60 mL/min (ref >60)
Glucose, Bld: 100 mg/dL — ABNORMAL HIGH (ref 70–99)
Potassium: 3.8 mmol/L (ref 3.5–5.1)
Sodium: 138 mmol/L (ref 135–145)

## 2022-01-11 LAB — URINALYSIS, MICROSCOPIC (REFLEX)
Squamous Epithelial / HPF: NONE SEEN (ref 0–5)
WBC, UA: NONE SEEN WBC/hpf (ref 0–5)

## 2022-01-11 MED ORDER — DIPHENOXYLATE-ATROPINE 2.5-0.025 MG PO TABS
2.0000 | ORAL_TABLET | Freq: Once | ORAL | Status: AC
  Administered 2022-01-11: 2 via ORAL
  Filled 2022-01-11: qty 2

## 2022-01-11 NOTE — ED Provider Notes (Signed)
?[facility] EMERGENCY DEPARTMENT ?Provider Note ? ? ?CSN: [id number] ?Arrival date & time: 01/11/22  2025 ? ?  ? ?History ? ?Chief Complaint  ?Patient presents with  ? Diarrhea  ? ? ?Kadeen Sroka is a 36 y.o. male. ? ?Patient with history of schizophrenia presents to the ED with two days of watery diarrhea. Denies abdominal pain. No recent sick contacts. No unusual oral intake. Diarrhea not associated or worse with oral intake. Normal oral intake. ? ?The history is provided by the patient. No language interpreter was used.  ?Diarrhea ?Quality:  Malodorous and watery ?Severity:  Moderate ?Onset quality:  Sudden ?Duration:  2 days ?Timing:  Intermittent ?Progression:  Unchanged ?Ineffective treatments:  None tried ?Associated symptoms: no abdominal pain and no fever   ?Risk factors: no recent antibiotic use, no sick contacts and no suspicious food intake   ? ?  ? ?Home Medications ?Prior to Admission medications   ?Medication Sig Start Date End Date Taking? Authorizing Provider  ?fluPHENAZine (PROLIXIN) 2.5 MG tablet TK 1 T PO QAM AND 2 TS QHS. DO NOT DRIVE OR OPERATE MACHINERY. DO NOT USE ALCOHOL 02/08/18   [provider]  ?   ? ?Allergies    ?Patient has no known allergies.   ? ?Review of Systems   ?Review of Systems  ?Constitutional:  Negative for fever.  ?Gastrointestinal:  Positive for diarrhea. Negative for abdominal pain.  ?All other systems reviewed and are negative. ? ?Physical Exam ?Updated Vital Signs ?BP 130/87 (BP Location: Left Arm)   Pulse 75   Temp 97.9 ?F (36.6 ?C) (Oral)   Resp 18   Ht 5\' 3"  (1.6 m)   Wt 95.3 kg   SpO2 98%   BMI 37.20 kg/m?  ?Physical Exam ?Constitutional:   ?   Appearance: Normal appearance.  ?HENT:  ?   Head: Normocephalic.  ?   Nose: Nose normal.  ?   Mouth/Throat:  ?   Mouth: Mucous membranes are moist.  ?Eyes:  ?   Conjunctiva/sclera: Conjunctivae normal.  ?Cardiovascular:  ?   Rate and Rhythm: Normal rate.  ?Pulmonary:  ?   Effort: Pulmonary  effort is normal.  ?Abdominal:  ?   Palpations: Abdomen is soft.  ?   Tenderness: There is no abdominal tenderness. There is no guarding.  ?Musculoskeletal:     ?   General: Normal range of motion.  ?Skin: ?   General: Skin is warm and dry.  ?Neurological:  ?   General: No focal deficit present.  ?   Mental Status: He is alert and oriented to person, place, and time.  ?Psychiatric:     ?   Mood and Affect: Mood normal.     ?   Behavior: Behavior normal.  ? ? ?ED Results / Procedures / Treatments   ?Labs ?(all labs ordered are listed, but only abnormal results are displayed) ?Labs Reviewed  ?CBC WITH DIFFERENTIAL/PLATELET - Abnormal; Notable for the following components:  ?    Result Value  ? WBC 10.8 (*)   ? MCH 25.8 (*)   ? Monocytes Absolute 1.3 (*)   ? All other components within normal limits  ?BASIC METABOLIC PANEL - Abnormal; Notable for the following components:  ? Glucose, Bld 100 (*)   ? All other components within normal limits  ? ? ?EKG ?None ? ?Radiology ?No results found. ? ?Procedures ?Procedures  ? ? ?Medications Ordered in ED ?Medications - No data to display ? ?ED Course/ Medical Decision Making/  A&P ?  ?  Lab results reviewed and shared with patient. No indication of hypokalemia.                      ?Medical Decision Making ? ?Patient presents with non-bloody diarrhea consistent with likely viral enteritis. Doubt invasive bacteria causing diarrhea such as c diff or shiga toxi. No recent travel. Patient is not immunocompromised. Non bloody diarrhea so less likely IBD. Low suspicion for giardia or parasites. Considered, but unlikely, SBO, appendicitis, diverticulitis, or other intra abdominal infection. ? ?Care instructions return precautions provided. Patient appears safe for discharge at this time. ? ? ? ? ? ? ? ?Final Clinical Impression(s) / ED Diagnoses ?Final diagnoses:  ?Diarrhea, unspecified type  ? ? ?Rx / DC Orders ?ED Discharge Orders   ? ?      Ordered  ?  loperamide (IMODIUM) 2 MG  capsule  4 times daily PRN       ? 01/12/22 0010  ? ?  ?  ? ?  ? ? ?  ?Felicie Morn, NP ?01/12/22 0019 ? ?  ?Tanda Rockers A, DO ?01/12/22 0200 ? ?

## 2022-01-11 NOTE — ED Notes (Signed)
Pt called for in lobby with no answer x 2  ?

## 2022-01-11 NOTE — ED Triage Notes (Signed)
Mid abdominal pain and diarrhea x 2 days .  ?

## 2022-01-11 NOTE — ED Triage Notes (Signed)
Pt states diarrhea x 2 days  ?Denies N/V  ?Denies any blood in stool  ? ?NAD at this time  ?

## 2022-01-12 MED ORDER — LOPERAMIDE HCL 2 MG PO CAPS: 2.0000 mg | ORAL_CAPSULE | Freq: Four times a day (QID) | ORAL | 0 refills | Status: AC | PRN

## 2022-01-12 NOTE — Discharge Instructions (Addendum)
Please refer to the attached instructions 

## 2022-04-12 ENCOUNTER — Encounter (HOSPITAL_BASED_OUTPATIENT_CLINIC_OR_DEPARTMENT_OTHER): Payer: Self-pay

## 2022-04-12 ENCOUNTER — Emergency Department (HOSPITAL_BASED_OUTPATIENT_CLINIC_OR_DEPARTMENT_OTHER)
Admission: EM | Admit: 2022-04-12 | Discharge: 2022-04-12 | Disposition: A | Discharge status: Home / Self Care | Payer: No Typology Code available for payment source | Attending: Emergency Medicine | Admitting: Emergency Medicine

## 2022-04-12 ENCOUNTER — Other Ambulatory Visit: Payer: Self-pay

## 2022-04-12 DIAGNOSIS — H9202 Otalgia, left ear: Secondary | ICD-10-CM | POA: Diagnosis present

## 2022-04-12 DIAGNOSIS — Z5321 Procedure and treatment not carried out due to patient leaving prior to being seen by health care provider: Secondary | ICD-10-CM | POA: Insufficient documentation

## 2022-04-12 NOTE — ED Triage Notes (Signed)
Pt presents with L ear otalgia for the last 45 minutes. Denies drainage.

## 2022-04-13 ENCOUNTER — Other Ambulatory Visit: Payer: Self-pay

## 2022-04-13 ENCOUNTER — Emergency Department (HOSPITAL_BASED_OUTPATIENT_CLINIC_OR_DEPARTMENT_OTHER)
Admission: EM | Admit: 2022-04-13 | Discharge: 2022-04-13 | Disposition: A | Discharge status: Home / Self Care | Payer: Medicaid Other | Attending: Emergency Medicine | Admitting: Emergency Medicine

## 2022-04-13 ENCOUNTER — Encounter (HOSPITAL_BASED_OUTPATIENT_CLINIC_OR_DEPARTMENT_OTHER): Payer: Self-pay

## 2022-04-13 DIAGNOSIS — H6692 Otitis media, unspecified, left ear: Secondary | ICD-10-CM | POA: Insufficient documentation

## 2022-04-13 DIAGNOSIS — Z87891 Personal history of nicotine dependence: Secondary | ICD-10-CM | POA: Diagnosis not present

## 2022-04-13 DIAGNOSIS — J069 Acute upper respiratory infection, unspecified: Secondary | ICD-10-CM | POA: Insufficient documentation

## 2022-04-13 DIAGNOSIS — H9202 Otalgia, left ear: Secondary | ICD-10-CM | POA: Diagnosis present

## 2022-04-13 MED ORDER — CETIRIZINE HCL 10 MG PO TABS: 10.0000 mg | ORAL_TABLET | Freq: Every day | ORAL | 0 refills | Status: AC

## 2022-04-13 MED ORDER — CARBAMIDE PEROXIDE 6.5 % OT SOLN: 5.0000 [drp] | Freq: Two times a day (BID) | OTIC | 0 refills | Status: AC

## 2022-04-13 MED ORDER — AMOXICILLIN-POT CLAVULANATE 875-125 MG PO TABS: 1.0000 | ORAL_TABLET | Freq: Two times a day (BID) | ORAL | 0 refills | Status: AC

## 2022-04-13 NOTE — ED Provider Notes (Signed)
[facility] EMERGENCY DEPARTMENT Provider Note   CSN: [id number] Arrival date & time: 04/13/22  1835     History  Chief Complaint  Patient presents with   Otalgia    Joe BookmanChristopher Barrett is a 36 y.o. male with chief complaint of left ear pain over the last 2 days.  Also endorses a sore throat and congestion over the last couple days.  Notes diminished hearing and tenderness of the left ear.  Denies discharge coming from or recent trauma to the ear.  Unsure of fever or chills.  Denies difficulty swallowing.  Denies cough, shortness of breath, chest pain, or headaches.  No recent antibiotic use.  The history is provided by the patient and medical records.  Otalgia Associated symptoms: sore throat       Home Medications Prior to Admission medications   Medication Sig Start Date End Date Taking? Authorizing Provider  amoxicillin-clavulanate (AUGMENTIN) 875-125 MG tablet Take 1 tablet by mouth every 12 (twelve) hours. 04/13/22  Yes Cecil Cobbsockerham, Jaslynn Thome M, PA-C  carbamide peroxide (DEBROX) 6.5 % OTIC solution Place 5 drops into the right ear 2 (two) times daily. 04/13/22  Yes Cecil Cobbsockerham, Aishia Barkey M, PA-C  cetirizine (ZYRTEC ALLERGY) 10 MG tablet Take 1 tablet (10 mg total) by mouth daily for 14 days. 04/13/22 04/27/22 Yes Cecil Cobbsockerham, Carmell Elgin M, PA-C  fluPHENAZine (PROLIXIN) 2.5 MG tablet TK 1 T PO QAM AND 2 TS QHS. DO NOT DRIVE OR OPERATE MACHINERY. DO NOT USE ALCOHOL 02/08/18   [provider]  loperamide (IMODIUM) 2 MG capsule Take 1 capsule (2 mg total) by mouth 4 (four) times daily as needed for diarrhea or loose stools. 01/12/22   Felicie MornSmith, David, NP      Allergies    Patient has no known allergies.    Review of Systems   Review of Systems  HENT:  Positive for ear pain and sore throat.    Physical Exam Updated Vital Signs BP 123/84 (BP Location: Left Arm)   Pulse 96   Temp 98.2 F (36.8 C) (Oral)   Resp 16   Ht 5\' 3"  (1.6 m)   Wt 99.8 kg   SpO2 95%   BMI 38.97 kg/m   Physical Exam Vitals and nursing note reviewed.  Constitutional:      General: He is not in acute distress.    Appearance: He is well-developed.  HENT:     Head: Normocephalic and atraumatic.     Comments: Mild erythema of the posterior oropharynx.  Uvula mildly swollen, but midline.  No tonsillar swelling or exudate.    Right Ear: Tympanic membrane, ear canal and external ear normal. There is impacted cerumen.     Left Ear: External ear normal. Tenderness present. Tympanic membrane is erythematous and bulging.     Ears:     Comments: Left TM mildly erythematous and bulging, with loss of bony landmarks.  With mild tenderness.  No visible discharge.    Nose: Rhinorrhea present. Rhinorrhea is clear.     Mouth/Throat:     Mouth: Mucous membranes are moist.     Pharynx: Oropharynx is clear. No oropharyngeal exudate.  Eyes:     Conjunctiva/sclera: Conjunctivae normal.  Neck:     Comments: Neck very supple Cardiovascular:     Rate and Rhythm: Normal rate and regular rhythm.     Heart sounds: No murmur heard. Pulmonary:     Effort: Pulmonary effort is normal. No respiratory distress.     Breath sounds: Normal breath sounds.  Abdominal:     Palpations: Abdomen is soft.     Tenderness: There is no abdominal tenderness.  Musculoskeletal:        General: No swelling.     Cervical back: Neck supple.  Lymphadenopathy:     Cervical: Cervical adenopathy present.  Skin:    General: Skin is warm and dry.     Capillary Refill: Capillary refill takes less than 2 seconds.  Neurological:     Mental Status: He is alert.  Psychiatric:        Mood and Affect: Mood normal.    ED Results / Procedures / Treatments   Labs (all labs ordered are listed, but only abnormal results are displayed) Labs Reviewed - No data to display  EKG None  Radiology No results found.  Procedures Procedures    Medications Ordered in ED Medications - No data to display  ED Course/ Medical Decision  Making/ A&P                           Medical Decision Making Amount and/or Complexity of Data Reviewed External Data Reviewed: notes. Labs: ordered. Decision-making details documented in ED Course. Radiology: ordered and independent interpretation performed. Decision-making details documented in ED Course. ECG/medicine tests: ordered and independent interpretation performed. Decision-making details documented in ED Course.  Risk OTC drugs. Prescription drug management.   36 y.o. male presents to the ED for concern of Otalgia   This involves an extensive number of treatment options, and is a complaint that carries with it a high risk of complications and morbidity.    Past Medical History / Co-morbidities / Social History: Hx of paranoid schizophrenia, prior appendectomy, former tobacco use  Physical Exam: Physical exam performed. The pertinent findings include: Mildly erythematous and bulging TM of the left ear with loss of bony landmarks.  Mildly erythematous posterior oropharynx.  Cervical lymphadenopathy.  Lab Tests: None  Imaging Studies: None  ED Course/Disposition: Pt well-appearing on exam.  Presenting with ear pain and sore throat.  ABCs intact.  Able to swallow water without difficulty in the ED.  Did not appreciate tonsillar exudate or swelling.  Mild cervical lymphadenopathy.  Not suspicious of retropharyngeal abscess or peritonsillar abscess.  Exam consistent with early acute otitis media.  Neck very supple.  No concern for acute mastoiditis, meningitis.  No antibiotic use in the last month.  Patient discharged home with Augmentin.  Also provided with prescription for Debrox, for cerumen reduction of the right ear canal on outpatient basis.  Recommend follow-up with PCP within the next 3 to 4 days for reevaluation and continued medical management, as well as conservative symptom management.   Patient in NAD in good condition at time of discharge.  After consideration of  the diagnostic results and the patient's encounter today, I feel that the emergency department workup does not suggest an emergent condition requiring admission or immediate intervention beyond what has been performed at this time.  The patient is safe for discharge and has been instructed to return immediately for worsening symptoms, change in symptoms or any other concerns.  Discussed course of treatment thoroughly with the patient, whom demonstrated understanding.  Patient in agreement and has no further questions.  I discussed this case with my attending physician Dr. Rubin Payor, who agreed with the proposed treatment course and cosigned this note including patient's presenting symptoms, physical exam, and planned diagnostics and interventions.  Attending physician stated agreement with plan or made changes  to plan which were implemented.     This chart was dictated using voice recognition software.  Despite best efforts to proofread, errors can occur which can change the documentation meaning.         Final Clinical Impression(s) / ED Diagnoses Final diagnoses:  Acute otitis media, left  Upper respiratory tract infection, unspecified type    Rx / DC Orders ED Discharge Orders          Ordered    amoxicillin-clavulanate (AUGMENTIN) 875-125 MG tablet  Every 12 hours        04/13/22 1943    cetirizine (ZYRTEC ALLERGY) 10 MG tablet  Daily        04/13/22 1943    carbamide peroxide (DEBROX) 6.5 % OTIC solution  2 times daily        04/13/22 1943              Sandrea Hammond 04/13/22 1947    Benjiman Core, MD 04/14/22 0006

## 2022-04-13 NOTE — Discharge Instructions (Addendum)
Several prescriptions have been sent to your pharmacy.  They are as follows: Augmentin-an antibiotic.  Please take 1 capsule every 12 hours for the next 5 days.  Always take with food and plenty water Zyrtec-decongestant.  Please take once daily for the next 10 to 14 days. Debrox-an earwax treatment.  Please use as instructed for the next 4-5 days for earwax removal of the right ear.  You may continue to manage pain with Tylenol and ibuprofen as needed.    Return to the ED for new or worsening symptoms as discussed.  Follow-up with your PCP within the next 3 to 4 days for reevaluation and continued medical management.  If you do not have 1, a list of resources been provided for you below.  If you do not have a doctor see the list below.  RESOURCE GUIDE  Chronic Pain Problems: Contact [facility]  [phone] Patients need to be referred by their primary care doctor.  Insufficient Money for Medicine: Contact United Way:  call "211" or [facility] [phone].  No Primary Care Doctor: Call Health Connect  [phone] - can help you locate a primary care doctor that  accepts your insurance, provides certain services, etc. Physician Referral Service- [phone] Agencies that provide inexpensive medical care: [facility]  [phone] [facility]  [phone] [facility]  [phone] [facility]  [phone] [facility]  [phone] [facility]  [phone]  Medicaid-accepting [facility] Providers: [facility]- [address], [address]  [phone], Mon-Fri 9am-7pm, Sat 9am-1pm [facility]- [address], [address] [place]  [phone] [facility]- [address], [address] [place]  [phone] [facility]- [address]  [phone] Renaye RakersVeita Bland- [address], [address], [phone]  Only accepts [place] patients  after they have their name  applied to their card  Self Pay (no insurance) in [facility]: Sickle Cell Patients: Dr Willey BladeEric Dean, [facility]  [address], [phone] [facility]- [address]  [phone]       Redge Gainer-     [facility]- [address]       -     [facility]- see information above (Speak to CitigroupPam H if you do not have insurance)       -  [facility]- [address], [phone]       -  [facility]- [address],  [phone]       -  [facility]- [address] [place] [address], [phone]       -  Dr Julio Sickssei-Bonsu-  [address], [address], [place], [phone]       -  [facility]- [address], [phone]       -  [facility]- [address], [phone], also [address], [phone]       -    [facility]- [address], [phone], 1st & 3rd Saturday   every month, 10am-1pm  1) Find a Doctor and Pay Out of Pocket Although you won't have to find out who is covered by your insurance plan, it is a good idea to ask around and get recommendations. You will then need to call the office and see if the doctor  you have chosen will accept you as a new patient and what types of options they offer for patients who are self-pay. Some doctors offer discounts or will set up payment plans for their patients who do not have insurance, but you will need to ask so you aren't surprised when you get to your appointment.  2) Contact Your Local Health Department Not all health departments have doctors that can see patients for sick visits, but many do, so it is worth a call to see if yours does. If you don't know where your local health department is, you can check in your phone book. The CDC also has a tool to help you locate your state's health department, and many state websites also have listings of all of their local health departments.  3) Find a  Walk-in Clinic If your illness is not likely to be very severe or complicated, you may want to try a walk in clinic. These are popping up all over the country in pharmacies, drugstores, and shopping centers. They're usually staffed by nurse practitioners or physician assistants that have been trained to treat common illnesses and complaints. They're usually fairly quick and inexpensive. However, if you have serious medical issues or chronic medical problems, these are probably not your best option  STD Testing [facility] Department of [facility], STD Clinic, [address] [place], phone [phone] or [phone].  Monday - Friday, call for an appointment. [facility] Department of [employer], STD Clinic, [address], [place], phone [phone] or [phone].  Monday - Friday, call for an appointment.  Abuse/Neglect: [facility] Child Abuse Hotline [phone] [facility] Child Abuse Hotline (713)535-7211 (After Hours)  Emergency Shelter:  [facility] 478-753-9359  Maternity Homes: [facility] at the [facility] [phone] [facility] [phone]  MRSA Hotline #:   484-252-4050  [facility] Resources  [facility] [place]     United Way                          [facility] Dept. [address] [place]                       [address]      [address] [place] [address]  Blondell Reveal Phone:  [phone]                                   Phone:  [phone]                 Phone:  [phone]  [facility], [phone] [phone]       -     [facility] in [place], [address],  [phone], [facility] Child Abuse  Hotline 628-004-8061 or 336-729-1431 (After Hours)  Behavioral Health Services  Substance Abuse Resources: [facility] Drug Services  [phone] [facility] [phone] [facility] [place] [phone] Floydene Flock [phone] Residential & Outpatient Substance Abuse Program  [phone]  Psychological Services: [facility]  [phone] [facility] Services  [phone] [facility], [phone] [place]. [address], [place], ACCESS LINE: [phone])[phone] or 727-366-1264, [employer].za  Dental Assistance  Patients with Medicaid: [facility] [phone] [address]                                           [phone] [employer] Phone:  [phone]                                                  Phone:  [phone]  If unable to pay or uninsured, contact:  [facility] or [facility]. to become qualified for the adult dental clinic.  Patients with Medicaid: [facility] [phone], [phone] [address] [phone]  If unable to pay, or uninsured, contact [facility] [phone]) or [facility] Department [phone] in [place], [phone] in [facility]) to become qualified for the adult dental clinic  Other Low-Cost Community Dental Services: [facility]- [address] [place], [place], [place], [address], [phone], [phone], 2nd and 4th Thursday of the month at 6:30am.  10 clients each day by appointment, can sometimes see walk-in patients if someone does not show for an appointment. [facility]- [address] [place] [place], [place], [address], [phone] [facility] [address] [place], [place], [address], [phone] [facility] Health Department- [phone] [facility] Department- [phone] [facility] [phone]

## 2022-04-13 NOTE — ED Triage Notes (Signed)
Left ear pain x yesterday

## 2022-04-23 ENCOUNTER — Encounter (HOSPITAL_BASED_OUTPATIENT_CLINIC_OR_DEPARTMENT_OTHER): Payer: Self-pay | Admitting: Emergency Medicine

## 2022-04-23 ENCOUNTER — Emergency Department (HOSPITAL_BASED_OUTPATIENT_CLINIC_OR_DEPARTMENT_OTHER)
Admission: EM | Admit: 2022-04-23 | Discharge: 2022-04-23 | Disposition: A | Discharge status: Home / Self Care | Payer: Medicaid Other | Attending: Emergency Medicine | Admitting: Emergency Medicine

## 2022-04-23 ENCOUNTER — Other Ambulatory Visit: Payer: Self-pay

## 2022-04-23 DIAGNOSIS — H9192 Unspecified hearing loss, left ear: Secondary | ICD-10-CM | POA: Diagnosis present

## 2022-04-23 DIAGNOSIS — H918X2 Other specified hearing loss, left ear: Secondary | ICD-10-CM | POA: Diagnosis not present

## 2022-04-23 NOTE — Discharge Instructions (Addendum)
There is no sign of inner ear infection or obstruction to left ear. Try taking sudafed to try to decongest the sinuses and see if this helps. You can buy this over the counter at any pharmacy. Please call [facility] to schedule follow up appointment for hearing loss.

## 2022-04-23 NOTE — ED Triage Notes (Signed)
Reports he had left ear pain on the 1st.  Was seen and treated for ear infection.  Reports still can't hear out of that ear.

## 2022-04-24 NOTE — ED Provider Notes (Signed)
[facility] EMERGENCY DEPARTMENT Provider Note   CSN: [id number] Arrival date & time: 04/23/22  2156     History Pmh: n/a Chief Complaint  Patient presents with   Ear Problem    Joe Barrett is a 36 y.o. male. Patient presents to the emergency department with decreased hearing on his left ear with associated tinnitus.  He says about a week ago he started experiencing left ear pain.  This was with associated nasal congestion.  He was seen by her emergency department and was diagnosed with a left ear infection.  He was placed on antibiotics and he is finished his course.  He says the pain is gone away but he still has decreased hearing in the left side.  Denies any fevers, ottorhea, continued congestion, or headaches. Denies recent trauma to head.   HPI     Home Medications Prior to Admission medications   Medication Sig Start Date End Date Taking? Authorizing Provider  amoxicillin-clavulanate (AUGMENTIN) 875-125 MG tablet Take 1 tablet by mouth every 12 (twelve) hours. 04/13/22   Cecil Cobbs, PA-C  carbamide peroxide (DEBROX) 6.5 % OTIC solution Place 5 drops into the right ear 2 (two) times daily. 04/13/22   Cecil Cobbs, PA-C  cetirizine (ZYRTEC ALLERGY) 10 MG tablet Take 1 tablet (10 mg total) by mouth daily for 14 days. 04/13/22 04/27/22  Cecil Cobbs, PA-C  fluPHENAZine (PROLIXIN) 2.5 MG tablet TK 1 T PO QAM AND 2 TS QHS. DO NOT DRIVE OR OPERATE MACHINERY. DO NOT USE ALCOHOL 02/08/18   [provider]  loperamide (IMODIUM) 2 MG capsule Take 1 capsule (2 mg total) by mouth 4 (four) times daily as needed for diarrhea or loose stools. 01/12/22   Felicie Morn, NP      Allergies    Patient has no known allergies.    Review of Systems   Review of Systems  HENT:  Positive for hearing loss.   All other systems reviewed and are negative.   Physical Exam Updated Vital Signs BP 126/86 (BP Location: Right Arm)   Pulse 81   Temp 98 F (36.7  C) (Oral)   Resp 18   Ht 5\' 3"  (1.6 m)   Wt 99.8 kg   SpO2 99%   BMI 38.97 kg/m  Physical Exam Vitals and nursing note reviewed.  Constitutional:      General: He is not in acute distress.    Appearance: Normal appearance. He is well-developed. He is not ill-appearing, toxic-appearing or diaphoretic.  HENT:     Head: Normocephalic and atraumatic.     Right Ear: There is impacted cerumen.     Left Ear: Decreased hearing noted. No drainage. There is no impacted cerumen. No mastoid tenderness. Tympanic membrane is not perforated, erythematous or bulging.     Ears:     Comments: There is no obstruction, perforated TM, erythematous TM, external ear tenderness or swelling present.  No signs of mastoiditis or mastoid tenderness.    Nose: No nasal deformity.     Mouth/Throat:     Lips: Pink. No lesions.  Eyes:     General: Gaze aligned appropriately. No scleral icterus.       Right eye: No discharge.        Left eye: No discharge.     Conjunctiva/sclera: Conjunctivae normal.     Right eye: Right conjunctiva is not injected. No exudate or hemorrhage.    Left eye: Left conjunctiva is not injected. No exudate or hemorrhage.  Pulmonary:     Effort: Pulmonary effort is normal. No respiratory distress.  Skin:    General: Skin is warm and dry.  Neurological:     Mental Status: He is alert and oriented to person, place, and time.  Psychiatric:        Mood and Affect: Mood normal.        Speech: Speech normal.        Behavior: Behavior normal. Behavior is cooperative.     ED Results / Procedures / Treatments   Labs (all labs ordered are listed, but only abnormal results are displayed) Labs Reviewed - No data to display  EKG None  Radiology No results found.  Procedures Procedures   Medications Ordered in ED Medications - No data to display  ED Course/ Medical Decision Making/ A&P                           Medical Decision Making  He presents with left ear decreased  hearing.  He has a recent otitis media infection that was treated with antibiotics.  The pain is resolved but he is still having muffled hearing on the left side.  I examined his ear and do not see any further evidence of otitis media or mastoiditis.  No signs of otitis externa. No TM perforation present. Not obstructed.   He may have some eustachian tube dysfunction from recent URI. Recommend trying decongestants. Do not think that he requires repeat antibiotics. I will give him ENT referral if hearing does not start improving.   Final Clinical Impression(s) / ED Diagnoses Final diagnoses:  Other specified hearing loss of left ear, unspecified hearing status on contralateral side    Rx / DC Orders ED Discharge Orders     None         Claudie Leach, PA-C 04/24/22 0017    Rolan Bucco, MD 04/26/22 1559

## 2022-05-19 ENCOUNTER — Other Ambulatory Visit: Payer: Self-pay

## 2022-05-19 ENCOUNTER — Encounter (HOSPITAL_BASED_OUTPATIENT_CLINIC_OR_DEPARTMENT_OTHER): Payer: Self-pay | Admitting: Emergency Medicine

## 2022-05-19 DIAGNOSIS — S41112A Laceration without foreign body of left upper arm, initial encounter: Secondary | ICD-10-CM | POA: Insufficient documentation

## 2022-05-19 DIAGNOSIS — Z23 Encounter for immunization: Secondary | ICD-10-CM | POA: Insufficient documentation

## 2022-05-19 DIAGNOSIS — W260XXA Contact with knife, initial encounter: Secondary | ICD-10-CM | POA: Insufficient documentation

## 2022-05-19 DIAGNOSIS — S4992XA Unspecified injury of left shoulder and upper arm, initial encounter: Secondary | ICD-10-CM | POA: Diagnosis present
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
## 2022-05-20 ENCOUNTER — Emergency Department (HOSPITAL_BASED_OUTPATIENT_CLINIC_OR_DEPARTMENT_OTHER)
Admission: EM | Admit: 2022-05-20 | Discharge: 2022-05-20 | Disposition: A | Discharge status: Home / Self Care | Payer: Medicaid Other | Attending: Emergency Medicine | Admitting: Emergency Medicine

## 2022-05-20 ENCOUNTER — Emergency Department (HOSPITAL_BASED_OUTPATIENT_CLINIC_OR_DEPARTMENT_OTHER): Payer: Medicaid Other

## 2022-05-20 DIAGNOSIS — S41112A Laceration without foreign body of left upper arm, initial encounter: Secondary | ICD-10-CM

## 2022-05-20 MED ORDER — TETANUS-DIPHTH-ACELL PERTUSSIS 5-2.5-18.5 LF-MCG/0.5 IM SUSY
0.5000 mL | PREFILLED_SYRINGE | Freq: Once | INTRAMUSCULAR | Status: AC
  Administered 2022-05-20: 0.5 mL via INTRAMUSCULAR
  Filled 2022-05-20: qty 0.5

## 2022-05-20 MED ORDER — LIDOCAINE-EPINEPHRINE 2 %-1:200000 IJ SOLN
20.0000 mL | Freq: Once | INTRAMUSCULAR | Status: AC
Start: 2022-05-20 — End: 2022-05-20
  Administered 2022-05-20: 20 mL via INTRADERMAL
  Filled 2022-05-20: qty 20

## 2022-05-20 MED ORDER — ACETAMINOPHEN 500 MG PO TABS
1000.0000 mg | ORAL_TABLET | Freq: Once | ORAL | Status: AC
  Administered 2022-05-20: 1000 mg via ORAL
  Filled 2022-05-20: qty 2

## 2022-05-20 NOTE — ED Notes (Signed)
Patient's arm bandaged.  Patient tolerated well.

## 2022-05-20 NOTE — ED Notes (Signed)
Lac cart placed at bedside.

## 2022-05-20 NOTE — ED Provider Notes (Signed)
[facility] EMERGENCY DEPARTMENT Provider Note   CSN: [id number] Arrival date & time: 05/19/22  2231     History  Chief Complaint  Patient presents with   Laceration    Joe Barrett is a 36 y.o. male.  The history is provided by the patient.  Laceration Location:  Shoulder/arm Shoulder/arm laceration location:  L forearm Length:  5 Depth:  Through dermis Quality: straight   Bleeding: controlled   Time since incident:  5 hours Laceration mechanism:  Unable to specify (while figthing) Pain details:    Quality:  Aching   Severity:  Mild   Timing:  Constant   Progression:  Unchanged Foreign body present:  No foreign bodies Relieved by:  Nothing Worsened by:  Nothing Ineffective treatments:  None tried Tetanus status:  Out of date Associated symptoms: no fever        Home Medications Prior to Admission medications   Medication Sig Start Date End Date Taking? Authorizing Provider  amoxicillin-clavulanate (AUGMENTIN) 875-125 MG tablet Take 1 tablet by mouth every 12 (twelve) hours. 04/13/22   Cecil Cobbs, PA-C  carbamide peroxide (DEBROX) 6.5 % OTIC solution Place 5 drops into the right ear 2 (two) times daily. 04/13/22   Cecil Cobbs, PA-C  cetirizine (ZYRTEC ALLERGY) 10 MG tablet Take 1 tablet (10 mg total) by mouth daily for 14 days. 04/13/22 04/27/22  Cecil Cobbs, PA-C  fluPHENAZine (PROLIXIN) 2.5 MG tablet TK 1 T PO QAM AND 2 TS QHS. DO NOT DRIVE OR OPERATE MACHINERY. DO NOT USE ALCOHOL 02/08/18   [provider]  loperamide (IMODIUM) 2 MG capsule Take 1 capsule (2 mg total) by mouth 4 (four) times daily as needed for diarrhea or loose stools. 01/12/22   Felicie Morn, NP      Allergies    Patient has no known allergies.    Review of Systems   Review of Systems  Constitutional:  Negative for fever.  HENT:  Negative for facial swelling.   Eyes:  Negative for redness.  Gastrointestinal:  Negative for abdominal pain.   Musculoskeletal:  Negative for arthralgias.  Skin:  Positive for wound.  Psychiatric/Behavioral:  Negative for agitation.     Physical Exam Updated Vital Signs BP 123/90   Pulse (!) 104   Temp 99 F (37.2 C) (Oral)   Resp 18   SpO2 95%  Physical Exam Vitals and nursing note reviewed. Exam conducted with a chaperone present.  Constitutional:      General: He is not in acute distress.    Appearance: He is well-developed. He is not diaphoretic.  HENT:     Head: Normocephalic and atraumatic.     Nose: Nose normal.  Eyes:     Conjunctiva/sclera: Conjunctivae normal.     Pupils: Pupils are equal, round, and reactive to light.  Cardiovascular:     Rate and Rhythm: Normal rate and regular rhythm.  Pulmonary:     Effort: Pulmonary effort is normal.     Breath sounds: Normal breath sounds. No wheezing or rales.  Abdominal:     General: Abdomen is flat. Bowel sounds are normal.     Palpations: Abdomen is soft.     Tenderness: There is no abdominal tenderness. There is no guarding or rebound.  Musculoskeletal:        General: Normal range of motion.       Arms:     Cervical back: Normal range of motion and neck supple.  Skin:  General: Skin is warm and dry.  Neurological:     Mental Status: He is alert and oriented to person, place, and time.     ED Results / Procedures / Treatments   Labs (all labs ordered are listed, but only abnormal results are displayed) Labs Reviewed - No data to display  EKG None  Radiology DG Forearm Left  Result Date: 05/20/2022 CLINICAL DATA:  Forearm laceration, possible foreign body, initial encounter EXAM: LEFT FOREARM - 2 VIEW COMPARISON:  None Available. FINDINGS: Laceration is noted along the distal ulna. No acute fracture or dislocation is seen. No radiopaque foreign body is seen. IMPRESSION: Soft tissue laceration without foreign body. No bony abnormality is noted. Electronically Signed   By: Alcide Clever M.D.   On: 05/20/2022 01:47     Procedures .Marland KitchenLaceration Repair  Date/Time: 05/20/2022 2:21 AM  Performed by: Cy Blamer, MD Authorized by: Cy Blamer, MD   Consent:    Consent obtained:  Verbal   Consent given by:  Patient   Risks, benefits, and alternatives were discussed: yes     Risks discussed:  Infection, need for additional repair and nerve damage   Alternatives discussed:  No treatment Universal protocol:    Procedure explained and questions answered to patient or proxy's satisfaction: yes     Patient identity confirmed:  Arm band Anesthesia:    Anesthesia method:  Local infiltration   Local anesthetic:  Lidocaine 1% WITH epi Laceration details:    Location:  Shoulder/arm   Shoulder/arm location:  L lower arm   Length (cm):  5   Depth (mm):  1 Pre-procedure details:    Preparation:  Patient was prepped and draped in usual sterile fashion Exploration:    Hemostasis achieved with:  Cautery   Imaging obtained: bedside ultrasound     Imaging outcome: foreign body not noted     Wound exploration: wound explored through full range of motion     Wound extent: no areolar tissue violation noted and no fascia violation noted   Treatment:    Area cleansed with:  Chlorhexidine and povidone-iodine   Amount of cleaning:  Extensive   Irrigation solution:  Sterile saline   Irrigation method:  Syringe   Debridement:  Minimal   Undermining:  Minimal   Scar revision: no   Skin repair:    Repair method:  Staples   Number of staples:  10 Approximation:    Approximation:  Close Repair type:    Repair type:  Complex Post-procedure details:    Dressing:  Sterile dressing   Procedure completion:  Tolerated well, no immediate complications Comments:     Patient and family informed no submersion for 2 weeks in any body of water, staple removal at urgent care in 12 days      Medications Ordered in ED Medications  lidocaine-EPINEPHrine (XYLOCAINE W/EPI) 2 %-1:200000 (PF) injection 20 mL (has no  administration in time range)  acetaminophen (TYLENOL) tablet 1,000 mg (1,000 mg Oral Given 05/20/22 0122)  Tdap (BOOSTRIX) injection 0.5 mL (0.5 mLs Intramuscular Given 05/20/22 0123)    ED Course/ Medical Decision Making/ A&P                           Medical Decision Making Laceration while fightinh   Amount and/or Complexity of Data Reviewed Independent Historian: spouse    Details: see above External Data Reviewed: notes.    Details: previous notes reviewed Radiology: ordered and independent interpretation  performed.    Details: No FB no fracture by me  Risk OTC drugs. Prescription drug management. Risk Details: No submersion for 14 days.  Keep clean and dry.  Staple removal in 12 days at urgent care.  Strict return precautions given.     Final Clinical Impression(s) / ED Diagnoses Final diagnoses:  Laceration of left upper extremity, initial encounter   Return for intractable cough, coughing up blood, fevers > 100.4 unrelieved by medication, shortness of breath, intractable vomiting, chest pain, shortness of breath, weakness, numbness, changes in speech, facial asymmetry, abdominal pain, passing out, Inability to tolerate liquids or food, cough, altered mental status or any concerns. No signs of systemic illness or infection. The patient is nontoxic-appearing on exam and vital signs are within normal limits.  I have reviewed the triage vital signs and the nursing notes. Pertinent labs & imaging results that were available during my care of the patient were reviewed by me and considered in my medical decision making (see chart for details). After history, exam, and medical workup I feel the patient has been appropriately medically screened and is safe for discharge home. Pertinent diagnoses were discussed with the patient. Patient was given return precautions.  Rx / DC Orders ED Discharge Orders     None         Deepti Gunawan, MD 05/20/22 8416

## 2022-05-20 NOTE — ED Notes (Signed)
Patient refused d/c vital signs. 

## 2022-06-04 ENCOUNTER — Emergency Department (HOSPITAL_BASED_OUTPATIENT_CLINIC_OR_DEPARTMENT_OTHER)
Admission: EM | Admit: 2022-06-04 | Discharge: 2022-06-04 | Disposition: A | Discharge status: Home / Self Care | Payer: Medicaid Other | Attending: Emergency Medicine | Admitting: Emergency Medicine

## 2022-06-04 ENCOUNTER — Other Ambulatory Visit: Payer: Self-pay

## 2022-06-04 ENCOUNTER — Encounter (HOSPITAL_BASED_OUTPATIENT_CLINIC_OR_DEPARTMENT_OTHER): Payer: Self-pay | Admitting: Pharmacy Technician

## 2022-06-04 DIAGNOSIS — Z4800 Encounter for change or removal of nonsurgical wound dressing: Secondary | ICD-10-CM | POA: Insufficient documentation

## 2022-06-04 DIAGNOSIS — Z5189 Encounter for other specified aftercare: Secondary | ICD-10-CM

## 2022-06-04 NOTE — ED Provider Notes (Signed)
[facility] EMERGENCY DEPARTMENT Provider Note   CSN: [id number] Arrival date & time: 06/04/22  1236     History  Chief Complaint  Patient presents with   Wound Check    Joe Barrett is a 36 y.o. male.  HPI 36 year old male presenting for wound check.  He reports having staples taken out several days ago at urgent care and noticed that the wound seems like it might be opening back open.  He denies any worsening redness, swelling, fevers, chills, discharge.    Home Medications Prior to Admission medications   Medication Sig Start Date End Date Taking? Authorizing Provider  amoxicillin-clavulanate (AUGMENTIN) 875-125 MG tablet Take 1 tablet by mouth every 12 (twelve) hours. 04/13/22   Cecil Cobbs, PA-C  carbamide peroxide (DEBROX) 6.5 % OTIC solution Place 5 drops into the right ear 2 (two) times daily. 04/13/22   Cecil Cobbs, PA-C  cetirizine (ZYRTEC ALLERGY) 10 MG tablet Take 1 tablet (10 mg total) by mouth daily for 14 days. 04/13/22 04/27/22  Cecil Cobbs, PA-C  fluPHENAZine (PROLIXIN) 2.5 MG tablet TK 1 T PO QAM AND 2 TS QHS. DO NOT DRIVE OR OPERATE MACHINERY. DO NOT USE ALCOHOL 02/08/18   [provider]  loperamide (IMODIUM) 2 MG capsule Take 1 capsule (2 mg total) by mouth 4 (four) times daily as needed for diarrhea or loose stools. 01/12/22   Felicie Morn, NP      Allergies    Patient has no known allergies.    Review of Systems   Review of Systems Ten systems reviewed and are negative for acute change, except as noted in the HPI.   Physical Exam Updated Vital Signs BP 121/81 (BP Location: Right Arm)   Pulse 90   Temp 98 F (36.7 C) (Oral)   Resp 20   Ht 5\' 3"  (1.6 m)   Wt 99.8 kg   SpO2 100%   BMI 38.97 kg/m  Physical Exam Vitals reviewed.  Constitutional:      Appearance: Normal appearance.  HENT:     Head: Normocephalic and atraumatic.  Eyes:     General:        Right eye: No discharge.        Left eye: No  discharge.     Extraocular Movements: Extraocular movements intact.     Conjunctiva/sclera: Conjunctivae normal.  Musculoskeletal:        General: No swelling. Normal range of motion.  Skin:    Comments: Evidence of healed wound to the left lateral forearm/wrist.  No evidence of dehiscence.  No surrounding erythema, warmth, discharge.  Neurological:     General: No focal deficit present.     Mental Status: He is alert and oriented to person, place, and time.  Psychiatric:        Mood and Affect: Mood normal.        Behavior: Behavior normal.     ED Results / Procedures / Treatments   Labs (all labs ordered are listed, but only abnormal results are displayed) Labs Reviewed - No data to display  EKG None  Radiology No results found.  Procedures Procedures    Medications Ordered in ED Medications - No data to display  ED Course/ Medical Decision Making/ A&P                           Medical Decision Making Patient presenting for wound check.  No evidence of dehiscence or  infection.  Patient reassured.  Stable for discharge.  Final Clinical Impression(s) / ED Diagnoses Final diagnoses:  Visit for wound check    Rx / DC Orders ED Discharge Orders     None         Mare Ferrari, PA-C 06/04/22 1329    Melene Plan, DO 06/04/22 1335

## 2022-06-04 NOTE — Discharge Instructions (Addendum)
You were evaluated in the Emergency Department and after careful evaluation, we did not find any emergent condition requiring admission or further testing in the hospital.  Please continue the wound care you have been providing.  Please return to the Emergency Department if you experience any worsening of your condition.  We encourage you to follow up with a primary care provider.  Thank you for allowing [place] to be a part of your care.

## 2022-06-04 NOTE — ED Triage Notes (Signed)
Pt had staples removed 3 days ago, states now the wound is reopening.

## 2022-11-07 ENCOUNTER — Emergency Department (HOSPITAL_BASED_OUTPATIENT_CLINIC_OR_DEPARTMENT_OTHER)
Admission: EM | Admit: 2022-11-07 | Discharge: 2022-11-07 | Disposition: A | Discharge status: Home / Self Care | Payer: Medicaid Other | Attending: Emergency Medicine | Admitting: Emergency Medicine

## 2022-11-07 ENCOUNTER — Other Ambulatory Visit: Payer: Self-pay

## 2022-11-07 ENCOUNTER — Encounter (HOSPITAL_BASED_OUTPATIENT_CLINIC_OR_DEPARTMENT_OTHER): Payer: Self-pay

## 2022-11-07 DIAGNOSIS — J069 Acute upper respiratory infection, unspecified: Secondary | ICD-10-CM | POA: Insufficient documentation

## 2022-11-07 DIAGNOSIS — Z1152 Encounter for screening for COVID-19: Secondary | ICD-10-CM | POA: Insufficient documentation

## 2022-11-07 DIAGNOSIS — R059 Cough, unspecified: Secondary | ICD-10-CM | POA: Diagnosis present

## 2022-11-07 LAB — RESP PANEL BY RT-PCR (RSV, FLU A&B, COVID)  RVPGX2
Influenza A by PCR: NEGATIVE
Influenza B by PCR: NEGATIVE
Resp Syncytial Virus by PCR: NEGATIVE
SARS Coronavirus 2 by RT PCR: NEGATIVE

## 2022-11-07 MED ORDER — ALBUTEROL SULFATE HFA 108 (90 BASE) MCG/ACT IN AERS: 1.0000 | INHALATION_SPRAY | Freq: Four times a day (QID) | RESPIRATORY_TRACT | 0 refills | Status: AC | PRN

## 2022-11-07 MED ORDER — LIDOCAINE 5 % EX PTCH: 1.0000 | MEDICATED_PATCH | CUTANEOUS | Status: DC

## 2022-11-07 NOTE — ED Triage Notes (Signed)
Exposed to flu, c/o cough, headache x 2 days

## 2022-11-07 NOTE — ED Provider Notes (Signed)
[facility] EMERGENCY DEPARTMENT Provider Note   CSN: [id number] Arrival date & time: 11/07/22  1649     History  Chief Complaint  Patient presents with   Cough    Joe Barrett is a 36 y.o. male, history of tobacco use, who presents to the ED secondary to cough, shortness of breath, and bodyaches for the last 2 days.  States that his mother was found to have influenza, and he wants to get checked for it.  Denies any nausea, vomiting, diarrhea.  No chest pain.  Home Medications Prior to Admission medications   Medication Sig Start Date End Date Taking? Authorizing Provider  albuterol (VENTOLIN HFA) 108 (90 Base) MCG/ACT inhaler Inhale 1-2 puffs into the lungs every 6 (six) hours as needed for wheezing or shortness of breath. 11/07/22  Yes Ronica Vivian L, PA  amoxicillin-clavulanate (AUGMENTIN) 875-125 MG tablet Take 1 tablet by mouth every 12 (twelve) hours. 04/13/22   Cecil Cobbs, PA-C  carbamide peroxide (DEBROX) 6.5 % OTIC solution Place 5 drops into the right ear 2 (two) times daily. 04/13/22   Cecil Cobbs, PA-C  cetirizine (ZYRTEC ALLERGY) 10 MG tablet Take 1 tablet (10 mg total) by mouth daily for 14 days. 04/13/22 04/27/22  Cecil Cobbs, PA-C  fluPHENAZine (PROLIXIN) 2.5 MG tablet TK 1 T PO QAM AND 2 TS QHS. DO NOT DRIVE OR OPERATE MACHINERY. DO NOT USE ALCOHOL 02/08/18   [provider]  loperamide (IMODIUM) 2 MG capsule Take 1 capsule (2 mg total) by mouth 4 (four) times daily as needed for diarrhea or loose stools. 01/12/22   Felicie Morn, NP      Allergies    Patient has no known allergies.    Review of Systems   Review of Systems  Constitutional:  Positive for fever. Negative for chills.  Respiratory:  Positive for cough and shortness of breath.   Cardiovascular:  Negative for chest pain.    Physical Exam Updated Vital Signs BP 130/89 (BP Location: Right Arm)   Pulse 90   Temp 97.7 F (36.5 C) (Oral)   Resp 18   Ht 5'  3" (1.6 m)   Wt 99.8 kg   SpO2 98%   BMI 38.97 kg/m  Physical Exam Vitals and nursing note reviewed.  Constitutional:      General: He is not in acute distress.    Appearance: He is well-developed.  HENT:     Head: Normocephalic and atraumatic.  Eyes:     Conjunctiva/sclera: Conjunctivae normal.  Cardiovascular:     Rate and Rhythm: Normal rate and regular rhythm.     Heart sounds: No murmur heard. Pulmonary:     Effort: Pulmonary effort is normal. No respiratory distress.     Breath sounds: Examination of the right-lower field reveals wheezing. Examination of the left-lower field reveals wheezing. Wheezing present.  Abdominal:     Palpations: Abdomen is soft.     Tenderness: There is no abdominal tenderness.  Musculoskeletal:        General: No swelling.     Cervical back: Neck supple.  Skin:    General: Skin is warm and dry.     Capillary Refill: Capillary refill takes less than 2 seconds.  Neurological:     Mental Status: He is alert.  Psychiatric:        Mood and Affect: Mood normal.     ED Results / Procedures / Treatments   Labs (all labs ordered are listed, but only  abnormal results are displayed) Labs Reviewed  RESP PANEL BY RT-PCR (RSV, FLU A&B, COVID)  RVPGX2    EKG None  Radiology No results found.  Procedures Procedures    Medications Ordered in ED Medications - No data to display  ED Course/ Medical Decision Making/ A&P                           Medical Decision Making Here for fatigue, cough, shortness of breath for the last 2 days.  Was exposed to mother, who has influenza.  Has wheezing in bilateral lower lobes, he does not want a chest x-ray.  Also reports a headache, will obtain viral testing.  Amount and/or Complexity of Data Reviewed Labs:     Details: COVID/flu/RSV negative  Discussion of management or test interpretation with external provider(s): Likely still has influenza although negative, discussed isolating from others, and  importance of using albuterol as needed.  Discussed return precautions such as severe shortness of breath, chest pain.  Neighbors respirations, well-appearing at exam.  Risk Prescription drug management.    Final Clinical Impression(s) / ED Diagnoses Final diagnoses:  Viral URI with cough    Rx / DC Orders ED Discharge Orders          Ordered    albuterol (VENTOLIN HFA) 108 (90 Base) MCG/ACT inhaler  Every 6 hours PRN        11/07/22 2029              Gabreille Dardis, [place], [place] 11/07/22 2032    Pricilla Loveless, MD 11/09/22 1006

## 2022-11-07 NOTE — Discharge Instructions (Addendum)
Please follow-up with your primary care doctor, use the albuterol for wheezing, and difficulty breathing.  If you have severe shortness of breath, chest pain, please return to the ER.  For your chest soreness after coughing, use can you can use lidocaine patches that can be found over-the-counter.  You take Tylenol and ibuprofen for your fever and bodyaches.

## 2023-01-20 ENCOUNTER — Other Ambulatory Visit: Payer: Self-pay

## 2023-01-20 ENCOUNTER — Emergency Department (HOSPITAL_BASED_OUTPATIENT_CLINIC_OR_DEPARTMENT_OTHER)
Admission: EM | Admit: 2023-01-20 | Discharge: 2023-01-20 | Disposition: A | Discharge status: Home / Self Care | Payer: Medicaid Other | Attending: Emergency Medicine | Admitting: Emergency Medicine

## 2023-01-20 DIAGNOSIS — K529 Noninfective gastroenteritis and colitis, unspecified: Secondary | ICD-10-CM | POA: Diagnosis not present

## 2023-01-20 DIAGNOSIS — Z87891 Personal history of nicotine dependence: Secondary | ICD-10-CM | POA: Diagnosis not present

## 2023-01-20 DIAGNOSIS — R197 Diarrhea, unspecified: Secondary | ICD-10-CM | POA: Diagnosis present

## 2023-01-20 MED ORDER — SIMETHICONE 40 MG/0.6ML PO SUSP
160.0000 mg | Freq: Once | ORAL | Status: AC
  Administered 2023-01-20: 160 mg via ORAL
  Filled 2023-01-20: qty 2.4

## 2023-01-20 MED ORDER — LOPERAMIDE HCL 2 MG PO CAPS
4.0000 mg | ORAL_CAPSULE | Freq: Once | ORAL | Status: AC
  Administered 2023-01-20: 4 mg via ORAL
  Filled 2023-01-20: qty 2

## 2023-01-20 NOTE — ED Notes (Signed)
Discharge paperwork reviewed entirely with patient, including Rx's and follow up care. Pain was under control. Pt verbalized understanding as well as all parties involved. No questions or concerns voiced at the time of discharge. No acute distress noted.   Pt ambulated out to PVA without incident or assistance.  

## 2023-01-20 NOTE — ED Triage Notes (Signed)
Pt here for diarrhea and HA since yesterday. Pt state yesterday he had a fever and lightheadedness, which have both gone away. Pt reports bloating, feeling nauseous, denies vomiting. Pt states HA is mild and intermittent.

## 2023-01-20 NOTE — ED Provider Notes (Signed)
[place] [facility] Provider Note  CSN: [id number] Arrival date & time: 01/20/23 2023  Chief Complaint(s) Diarrhea and Headache  HPI Joe Barrett is a 37 y.o. male with history of schizophrenia presenting to the emergency department with diarrhea.  Patient reports that yesterday he developed loose stool and diarrhea.  No bloody stool.  He also reports mild nausea and diffuse abdominal pain.  He had a subjective fever yesterday as well as a mild headache.  He reports that the headache has resolved.  He reports that his son has similar symptoms.  No recent travel.  No recent antibiotic use.   Past Medical History Past Medical History:  Diagnosis Date   Paranoid schizophrenia [facility])    Patient Active Problem List   Diagnosis Date Noted   Knee effusion, right 05/24/2020   Home Medication(s) Prior to Admission medications   Medication Sig Start Date End Date Taking? Authorizing Provider  albuterol (VENTOLIN HFA) 108 (90 Base) MCG/ACT inhaler Inhale 1-2 puffs into the lungs every 6 (six) hours as needed for wheezing or shortness of breath. 11/07/22   Small, Brooke L, PA  amoxicillin-clavulanate (AUGMENTIN) 875-125 MG tablet Take 1 tablet by mouth every 12 (twelve) hours. [id number]   Prince Rome, PA-C  carbamide peroxide (DEBROX) 6.5 % OTIC solution Place 5 drops into the right ear 2 (two) times daily. [id number]   Prince Rome, PA-C  cetirizine (ZYRTEC ALLERGY) 10 MG tablet Take 1 tablet (10 mg total) by mouth daily for 14 days. 04/13/22 0000000  Prince Rome, PA-C  fluPHENAZine (PROLIXIN) 2.5 MG tablet TK 1 T PO QAM AND 2 TS QHS. DO NOT DRIVE OR OPERATE MACHINERY. DO NOT USE ALCOHOL 02/08/18   [provider]  loperamide (IMODIUM) 2 MG capsule Take 1 capsule (2 mg total) by mouth 4 (four) times daily as needed for diarrhea or loose stools. 01/12/22   Etta Quill, NP                                                                                                                                     Past Surgical History Past Surgical History:  Procedure Laterality Date   APPENDECTOMY     Family History No family history on file.  Social History Social History   Tobacco Use   Smoking status: Former    Packs/day: 0.50    Types: Cigarettes   Smokeless tobacco: Never  Vaping Use   Vaping Use: Every day  Substance Use Topics   Alcohol use: Not Currently    Comment: occ   Drug use: Never   Allergies Patient has no known allergies.  Review of Systems Review of Systems  All other systems reviewed and are negative.   Physical Exam Vital Signs  I have reviewed the triage vital signs BP 128/85   Pulse 90   Temp 98.4 F (36.9 C) (Oral)   Resp  18   SpO2 95%  Physical Exam Vitals and nursing note reviewed.  Constitutional:      General: He is not in acute distress.    Appearance: Normal appearance.  HENT:     Mouth/Throat:     Mouth: Mucous membranes are moist.  Eyes:     Conjunctiva/sclera: Conjunctivae normal.  Cardiovascular:     Rate and Rhythm: Normal rate and regular rhythm.  Pulmonary:     Effort: Pulmonary effort is normal. No respiratory distress.     Breath sounds: Normal breath sounds.  Abdominal:     General: Abdomen is flat.     Palpations: Abdomen is soft.     Tenderness: There is no abdominal tenderness.  Musculoskeletal:     Right lower leg: No edema.     Left lower leg: No edema.  Skin:    General: Skin is warm and dry.     Capillary Refill: Capillary refill takes less than 2 seconds.  Neurological:     Mental Status: He is alert and oriented to person, place, and time. Mental status is at baseline.  Psychiatric:        Mood and Affect: Mood normal.        Behavior: Behavior normal.     ED Results and Treatments Labs (all labs ordered are listed, but only abnormal results are displayed) Labs Reviewed  RESP PANEL BY RT-PCR (RSV, FLU A&B, COVID)  RVPGX2                                                                                                                           Radiology No results found.  Pertinent labs & imaging results that were available during my care of the patient were reviewed by me and considered in my medical decision making (see MDM for details).  Medications Ordered in ED Medications  loperamide (IMODIUM) capsule 4 mg (has no administration in time range)  simethicone (MYLICON) chewable tablet 160 mg (has no administration in time range)                                                                                                                                     Procedures Procedures  (including critical care time)  Medical Decision Making / ED Course   MDM:  37 year old male presenting to the emergency department diarrhea.  Patient well-appearing, physical exam unremarkable.  Patient  appears well-hydrated.  Vital signs reassuring, no tachycardia.  No abdominal tenderness.  Suspect likely viral gastroenteritis given diarrhea and nausea as well as subjective fevers.  He does have sick contacts.  No risk factors for invasive diarrhea or C. difficile and no bloody stools.  Discussed self-care with Imodium and simethicone.  Will send COVID/flu swab as this may be a possible cause.  He appears well-hydrated, doubt laboratory testing would be beneficial.  Offered prescription for Zofran but patient reports that this is resolved and he doesn't need it. Will discharge patient to home. All questions answered. Patient comfortable with plan of discharge. Return precautions discussed with patient and specified on the after visit summary.       Lab Tests: -I ordered, reviewed, and interpreted labs.   The pertinent results include:   Labs Reviewed  RESP PANEL BY RT-PCR (RSV, FLU A&B, COVID)  RVPGX2    Medicines ordered and prescription drug management: Meds ordered this encounter  Medications   loperamide (IMODIUM) capsule  4 mg   simethicone (MYLICON) chewable tablet 160 mg    -I have reviewed the patients home medicines and have made adjustments as needed  Social Determinants of Health:  Diagnosis or treatment significantly limited by social determinants of health: former smoker and obesity   Reevaluation: After the interventions noted above, I reevaluated the patient and found that their symptoms have improved  Co morbidities that complicate the patient evaluation  Past Medical History:  Diagnosis Date   Paranoid schizophrenia ([place])       Dispostion: Disposition decision including need for hospitalization was considered, and patient discharged from emergency department.    Final Clinical Impression(s) / ED Diagnoses Final diagnoses:  Gastroenteritis     This chart was dictated using voice recognition software.  Despite best efforts to proofread,  errors can occur which can change the documentation meaning.    Cristie Hem, MD 01/20/23 2213

## 2023-01-20 NOTE — Discharge Instructions (Addendum)
We evaluated you for your diarrhea.  Your symptoms are most likely due to a viral infection.  Please buy Imodium for your diarrhea.  You can get this over-the-counter.  You can take 2 mg every 4 hours as needed for diarrhea or loose stools.  You can also try a medication called simethicone for your gas pain.  This is also over-the-counter.  You can take 80 mg 4 times daily for your symptoms.  Please follow-up with your primary doctor.  Please return to the emergency department if you develop any severe abdominal pain, uncontrolled vomiting, bloody stools, or any other concerning symptoms.

## 2023-02-26 ENCOUNTER — Encounter: Payer: Self-pay | Admitting: *Deleted

## 2023-03-07 ENCOUNTER — Emergency Department (HOSPITAL_BASED_OUTPATIENT_CLINIC_OR_DEPARTMENT_OTHER)
Admission: EM | Admit: 2023-03-07 | Discharge: 2023-03-07 | Disposition: A | Discharge status: Home / Self Care | Payer: Medicaid Other | Attending: Emergency Medicine | Admitting: Emergency Medicine

## 2023-03-07 ENCOUNTER — Encounter (HOSPITAL_BASED_OUTPATIENT_CLINIC_OR_DEPARTMENT_OTHER): Payer: Self-pay

## 2023-03-07 ENCOUNTER — Emergency Department (HOSPITAL_BASED_OUTPATIENT_CLINIC_OR_DEPARTMENT_OTHER): Payer: Medicaid Other

## 2023-03-07 ENCOUNTER — Other Ambulatory Visit: Payer: Self-pay

## 2023-03-07 DIAGNOSIS — M79671 Pain in right foot: Secondary | ICD-10-CM | POA: Insufficient documentation

## 2023-03-07 MED ORDER — NAPROXEN 500 MG PO TABS: 500.0000 mg | ORAL_TABLET | Freq: Two times a day (BID) | ORAL | 0 refills | Status: DC

## 2023-03-07 NOTE — Discharge Instructions (Signed)
Foot and heel pain.  Your x-rays are reassuring.  We are going to give you anti-inflammatories. Please perform the stretches as discussed and follow-up with the podiatrist.  Come back if you have redness, fevers or swelling of the foot or any other new or worsening symptoms.

## 2023-03-07 NOTE — ED Triage Notes (Signed)
C/o right heel pain and posterior ankle pain x 2 months. States pain is better when standing. Has not been taking pain meds.

## 2023-03-07 NOTE — ED Provider Notes (Signed)
[facility] Provider Note   CSN: [id number] Arrival date & time: 03/07/23  5643     History  Chief Complaint  Patient presents with   Foot Pain    Joe Barrett is a 37 y.o. male.  He denies any chronic medical issues.  The ER for 2 months of right heel pain.  He states it is worse when he has been sitting down for long time and starts to walk.  It is both on the bottom of the right heel and the posterior aspect of the right heel.  He denies any injury or trauma, denies prolonged standing, denies footwear change.  He states he has been told in the past he has plantar fasciitis and thinks he may still have this but he states he is also having now pain in the back of heel as well as the bottom of his foot.  He has not taken any medications.  Has not tried stretching or shoe inserts or any other measures.  Denies any redness, swelling, fevers  He states he has no pain as long as he is wearing shoes and walking around but he sits for a long period or is wearing no she shoes or unsupportive footwear such as slides he does have pain   Foot Pain       Home Medications Prior to Admission medications   Medication Sig Start Date End Date Taking? Authorizing Provider  naproxen (NAPROSYN) 500 MG tablet Take 1 tablet (500 mg total) by mouth 2 (two) times daily. 03/07/23  Yes Stephannie Broner A, PA-C  albuterol (VENTOLIN HFA) 108 (90 Base) MCG/ACT inhaler Inhale 1-2 puffs into the lungs every 6 (six) hours as needed for wheezing or shortness of breath. 11/07/22   Small, Brooke L, PA  amoxicillin-clavulanate (AUGMENTIN) 875-125 MG tablet Take 1 tablet by mouth every 12 (twelve) hours. 04/13/22   Cecil Cobbs, PA-C  carbamide peroxide (DEBROX) 6.5 % OTIC solution Place 5 drops into the right ear 2 (two) times daily. 04/13/22   Cecil Cobbs, PA-C  cetirizine (ZYRTEC ALLERGY) 10 MG tablet Take 1 tablet (10 mg total) by mouth daily for 14  days. 04/13/22 04/27/22  Cecil Cobbs, PA-C  fluPHENAZine (PROLIXIN) 2.5 MG tablet TK 1 T PO QAM AND 2 TS QHS. DO NOT DRIVE OR OPERATE MACHINERY. DO NOT USE ALCOHOL 02/08/18   [provider]  loperamide (IMODIUM) 2 MG capsule Take 1 capsule (2 mg total) by mouth 4 (four) times daily as needed for diarrhea or loose stools. 01/12/22   Felicie Morn, NP      Allergies    Patient has no known allergies.    Review of Systems   Review of Systems  Physical Exam Updated Vital Signs BP 123/83 (BP Location: Left Arm)   Pulse 84   Temp 98.1 F (36.7 C) (Oral)   Resp 18   Ht  (1.6 m)   Wt 99.8 kg   SpO2 95%   BMI 38.97 kg/m  Physical Exam Vitals and nursing note reviewed.  Constitutional:      General: He is not in acute distress.    Appearance: He is well-developed.  HENT:     Head: Normocephalic and atraumatic.  Eyes:     Conjunctiva/sclera: Conjunctivae normal.  Cardiovascular:     Rate and Rhythm: Normal rate and regular rhythm.     Heart sounds: No murmur heard. Pulmonary:     Effort: Pulmonary effort is  normal. No respiratory distress.     Breath sounds: Normal breath sounds.  Abdominal:     Palpations: Abdomen is soft.     Tenderness: There is no abdominal tenderness.  Musculoskeletal:        General: No swelling.     Cervical back: Neck supple.     Right ankle: No swelling, deformity or lacerations. Normal range of motion. Abnormal pulse.     Right Achilles Tendon: Tenderness present. No defects.     Right foot: Normal range of motion and normal capillary refill. Tenderness present. No swelling, deformity or crepitus. Normal pulse.     Comments: Mild tenderness to the insertion of the Achilles tendon and over the plantar aspect of the calcaneus.  Skin:    General: Skin is warm and dry.     Capillary Refill: Capillary refill takes less than 2 seconds.  Neurological:     General: No focal deficit present.     Mental Status: He is alert and oriented to  person, place, and time.  Psychiatric:        Mood and Affect: Mood normal.     ED Results / Procedures / Treatments   Labs (all labs ordered are listed, but only abnormal results are displayed) Labs Reviewed - No data to display  EKG None  Radiology DG Ankle Complete Right  Result Date: 03/07/2023 CLINICAL DATA:  Right ankle pain for 2 months. EXAM: RIGHT ANKLE - COMPLETE 3+ VIEW COMPARISON:  None Available. FINDINGS: There is no evidence of fracture, dislocation, or joint effusion. There is no evidence of arthropathy or other focal bone abnormality. Soft tissues are unremarkable. IMPRESSION: Negative. Electronically Signed   By: Lupita Raider M.D.   On: 03/07/2023 10:12    Procedures Procedures    Medications Ordered in ED Medications - No data to display  ED Course/ Medical Decision Making/ A&P                             Medical Decision Making DDX: tendonitis, plantar fasciitis, fracture, sprain, other ED course: Patient presents for 2 months of right heel pain without any injury, no swelling redness or warmth.  It is better with wearing shoes and motion, worse in the mornings when he first stands up or if he is immobile for a period of time.  He has some tenderness over the heel on the plantar aspect and over the insertion of the Achilles tendon with no swelling, no defect, no limited range of motion of the ankle in any direction.  Discussed likely soft tissue, plantar fasciitis versus Achilles tendinitis.  He was advised to continue wearing supportive footwear, advised on stretches, discussed NSAID use, follow-up with podiatry and strict return precautions given  Amount and/or Complexity of Data Reviewed Radiology: ordered.           Final Clinical Impression(s) / ED Diagnoses Final diagnoses:  Foot pain, right    Rx / DC Orders ED Discharge Orders          Ordered    naproxen (NAPROSYN) 500 MG tablet  2 times daily        03/07/23 [address] 03/07/23 1034    Rondel Baton, MD 03/07/23 1702

## 2023-03-07 NOTE — ED Notes (Signed)
Discharge instructions reviewed with patient. Patient verbalizes understanding, no further questions at this time. Medications/prescriptions and follow up information provided. No acute distress noted at time of departure.  

## 2023-07-20 ENCOUNTER — Emergency Department (HOSPITAL_BASED_OUTPATIENT_CLINIC_OR_DEPARTMENT_OTHER)
Admission: EM | Admit: 2023-07-20 | Discharge: 2023-07-20 | Disposition: A | Discharge status: Home / Self Care | Payer: MEDICAID | Attending: Emergency Medicine | Admitting: Emergency Medicine

## 2023-07-20 ENCOUNTER — Other Ambulatory Visit: Payer: Self-pay

## 2023-07-20 DIAGNOSIS — U071 COVID-19: Secondary | ICD-10-CM | POA: Diagnosis not present

## 2023-07-20 DIAGNOSIS — J069 Acute upper respiratory infection, unspecified: Secondary | ICD-10-CM

## 2023-07-20 DIAGNOSIS — R059 Cough, unspecified: Secondary | ICD-10-CM | POA: Diagnosis present

## 2023-07-20 LAB — SARS CORONAVIRUS 2 BY RT PCR: SARS Coronavirus 2 by RT PCR: POSITIVE — AB

## 2023-07-20 MED ORDER — ACETAMINOPHEN 325 MG PO TABS
650.0000 mg | ORAL_TABLET | Freq: Once | ORAL | Status: AC
  Administered 2023-07-20: 650 mg via ORAL
  Filled 2023-07-20: qty 2

## 2023-07-20 NOTE — ED Triage Notes (Addendum)
Pt c/o cough, runny nose, sore throat, and chills that started today. No meds PTA. Of note, during triage pt states that his wife was diagnosed with COVID earlier in the week.

## 2023-07-20 NOTE — ED Provider Notes (Signed)
[place] [facility] Provider Note   CSN: [id number] Arrival date & time: 07/20/23  1026     History  Chief Complaint  Patient presents with   Sore Throat    Joe Barrett is a 37 y.o. male with overall noncontributory past medical history presents concern for cough, runny nose, sore throat, chills that started yesterday.  No meds prior to arrival.  Of note patient reports wife recently diagnosed with COVID earlier this week.  Denies any nausea, vomiting, chest pain, shortness of breath.   Sore Throat       Home Medications Prior to Admission medications   Medication Sig Start Date End Date Taking? Authorizing Provider  albuterol (VENTOLIN HFA) 108 (90 Base) MCG/ACT inhaler Inhale 1-2 puffs into the lungs every 6 (six) hours as needed for wheezing or shortness of breath. 11/07/22   Small, Brooke L, PA  amoxicillin-clavulanate (AUGMENTIN) 875-125 MG tablet Take 1 tablet by mouth every 12 (twelve) hours. 04/13/22   Cecil Cobbs, PA-C  carbamide peroxide (DEBROX) 6.5 % OTIC solution Place 5 drops into the right ear 2 (two) times daily. 04/13/22   Cecil Cobbs, PA-C  cetirizine (ZYRTEC ALLERGY) 10 MG tablet Take 1 tablet (10 mg total) by mouth daily for 14 days. 04/13/22 04/27/22  Cecil Cobbs, PA-C  fluPHENAZine (PROLIXIN) 2.5 MG tablet TK 1 T PO QAM AND 2 TS QHS. DO NOT DRIVE OR OPERATE MACHINERY. DO NOT USE ALCOHOL 02/08/18   [provider]  loperamide (IMODIUM) 2 MG capsule Take 1 capsule (2 mg total) by mouth 4 (four) times daily as needed for diarrhea or loose stools. 01/12/22   Felicie Morn, NP  naproxen (NAPROSYN) 500 MG tablet Take 1 tablet (500 mg total) by mouth 2 (two) times daily. 03/07/23   Carmel Sacramento A, PA-C      Allergies    Patient has no known allergies.    Review of Systems   Review of Systems  All other systems reviewed and are negative.   Physical Exam Updated Vital Signs BP (!) 132/92    Pulse (!) 101   Temp 100.2 F (37.9 C) (Oral)   Resp 20   SpO2 98%  Physical Exam Vitals and nursing note reviewed.  Constitutional:      General: He is not in acute distress.    Appearance: Normal appearance.  HENT:     Head: Normocephalic and atraumatic.     Mouth/Throat:     Comments: Mild posterior oropharynx erythema, swelling, exudate. Uvula midline, tonsils 2+ bilaterally.  No trismus, stridor, evidence of PTA, floor of mouth swelling or redness. No cervical lymphadenopathy.  Eyes:     General:        Right eye: No discharge.        Left eye: No discharge.  Cardiovascular:     Rate and Rhythm: Normal rate and regular rhythm.  Pulmonary:     Effort: Pulmonary effort is normal. No respiratory distress.  Musculoskeletal:        General: No deformity.  Skin:    General: Skin is warm and dry.  Neurological:     Mental Status: He is alert and oriented to person, place, and time.  Psychiatric:        Mood and Affect: Mood normal.        Behavior: Behavior normal.     ED Results / Procedures / Treatments   Labs (all labs ordered are listed, but only abnormal results  are displayed) Labs Reviewed  SARS CORONAVIRUS 2 BY RT PCR    EKG None  Radiology No results found.  Procedures Procedures    Medications Ordered in ED Medications  acetaminophen (TYLENOL) tablet 650 mg (has no administration in time range)    ED Course/ Medical Decision Making/ A&P                                 Medical Decision Making   This is a well-appearing 16 who presents with concern for 2 days of cough, fever, sore throat, bodyache.  My emergent differential diagnosis includes acute upper respiratory infection with COVID, flu, RSV versus new asthma presentation, acute bronchitis, less clinical concern for pneumonia.  Also considered other ENT emergencies, Ludwig angina, strep pharyngitis, mono, versus epiglottis, tonsillitis versus other.  This is not an exhaustive differential.   On my exam patient is overall well-appearing, they have temperature of 100.2, breathing unlabored, no tachypnea, no respiratory distress, stable oxygen saturation.  Patient with mild tachycardia, 101, likely 2/2 fever. RVP pending at time of discharge, however given recent diagnosis of wife with COVID I have high suspicion for COVID, discussed that I do not think that my management will change even if results is positive, patient with no additional medical conditions or severe symptoms to suggest antiviral therapy would give significant benefit at this time.  Patient symptoms are consistent with viral URI, likely covid.  Encouraged ibuprofen, Tylenol, rest, plenty of fluids, supportive care .  Discussed extensive return precautions.  Patient discharged in stable condition at this time.  Final Clinical Impression(s) / ED Diagnoses Final diagnoses:  Viral upper respiratory tract infection    Rx / DC Orders ED Discharge Orders     None         [place] 07/20/23 1106    Terald Sleeper, MD 07/20/23 1122

## 2023-07-20 NOTE — Discharge Instructions (Signed)
You can check your patient portal in a few hours to confirm diagnosis of COVID.  You can use Motrin, Tylenol as needed for body aches, chills.  Over-the-counter cough and cold medication to help with any cough, sore throat.  I recommend drinking plenty of fluids and getting plenty of rest.

## 2023-07-22 ENCOUNTER — Other Ambulatory Visit: Payer: Self-pay

## 2023-07-22 ENCOUNTER — Encounter (HOSPITAL_BASED_OUTPATIENT_CLINIC_OR_DEPARTMENT_OTHER): Payer: Self-pay | Admitting: *Deleted

## 2023-07-22 ENCOUNTER — Emergency Department (HOSPITAL_BASED_OUTPATIENT_CLINIC_OR_DEPARTMENT_OTHER)
Admission: EM | Admit: 2023-07-22 | Discharge: 2023-07-22 | Disposition: A | Discharge status: Against Medical Advice | Payer: MEDICAID | Attending: Emergency Medicine | Admitting: Emergency Medicine

## 2023-07-22 DIAGNOSIS — Z5321 Procedure and treatment not carried out due to patient leaving prior to being seen by health care provider: Secondary | ICD-10-CM | POA: Insufficient documentation

## 2023-07-22 DIAGNOSIS — H9201 Otalgia, right ear: Secondary | ICD-10-CM | POA: Diagnosis present

## 2023-07-22 NOTE — ED Triage Notes (Signed)
Pt is here for right ear pain since last night.  Pt appears to have cerumen impaction visible in ear.  Pt informs me that he tested positive for covid 2 days.

## 2023-08-07 ENCOUNTER — Encounter (HOSPITAL_BASED_OUTPATIENT_CLINIC_OR_DEPARTMENT_OTHER): Payer: Self-pay

## 2023-08-07 ENCOUNTER — Other Ambulatory Visit: Payer: Self-pay

## 2023-08-07 ENCOUNTER — Emergency Department (HOSPITAL_BASED_OUTPATIENT_CLINIC_OR_DEPARTMENT_OTHER)
Admission: EM | Admit: 2023-08-07 | Discharge: 2023-08-07 | Disposition: A | Discharge status: Home / Self Care | Payer: MEDICAID | Attending: Emergency Medicine | Admitting: Emergency Medicine

## 2023-08-07 DIAGNOSIS — M25512 Pain in left shoulder: Secondary | ICD-10-CM | POA: Diagnosis present

## 2023-08-07 DIAGNOSIS — M62838 Other muscle spasm: Secondary | ICD-10-CM | POA: Diagnosis not present

## 2023-08-07 MED ORDER — ETODOLAC 400 MG PO TABS: 400.0000 mg | ORAL_TABLET | Freq: Two times a day (BID) | ORAL | 0 refills | Status: AC

## 2023-08-07 MED ORDER — METHOCARBAMOL 500 MG PO TABS: 500.0000 mg | ORAL_TABLET | Freq: Two times a day (BID) | ORAL | 0 refills | Status: AC

## 2023-08-07 MED ORDER — KETOROLAC TROMETHAMINE 15 MG/ML IJ SOLN
15.0000 mg | Freq: Once | INTRAMUSCULAR | Status: AC
  Administered 2023-08-07: 15 mg via INTRAMUSCULAR
  Filled 2023-08-07: qty 1

## 2023-08-07 MED ORDER — LIDOCAINE 5 % EX PTCH: 1.0000 | MEDICATED_PATCH | CUTANEOUS | 0 refills | Status: AC

## 2023-08-07 MED ORDER — LIDOCAINE 5 % EX PTCH
1.0000 | MEDICATED_PATCH | CUTANEOUS | Status: DC
  Administered 2023-08-07: 1 via TRANSDERMAL
  Filled 2023-08-07: qty 1

## 2023-08-07 NOTE — ED Notes (Signed)
Reviewed discharge instructions with pt  pt states understanding

## 2023-08-07 NOTE — Discharge Instructions (Signed)
Your exam was reassuring.  You likely have a muscle spasm.  He medication sent into the pharmacy for you.  The muscle relaxer called Robaxin will make you drowsy.  Do not drive after taking this medication.  Do not combine Lodine which is an anti-inflammatory medication with other anti-inflammatory such as Advil or Aleve.  If any concerning symptoms return to the emergency room.

## 2023-08-07 NOTE — ED Provider Notes (Signed)
[place] [facility] Provider Note   CSN: [id number] Arrival date & time: 08/07/23  1038     History  Chief Complaint  Patient presents with   Shoulder Pain    Joe Barrett is a 37 y.o. male.  37 year old male presents today for concern of left shoulder pain.  This started this morning after he woke up.  No injury.  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  Has not taken anything before coming in.  No other complaints.  The history is provided by the patient. No language interpreter was used.       Home Medications Prior to Admission medications   Medication Sig Start Date End Date Taking? Authorizing Provider  etodolac (LODINE) 400 MG tablet Take 1 tablet (400 mg total) by mouth 2 (two) times daily. 08/07/23  Yes Pamila Mendibles, PA-C  lidocaine (LIDODERM) 5 % Place 1 patch onto the skin daily. Remove & Discard patch within 12 hours or as directed by MD 08/07/23  Yes Karie Mainland, Emeterio Balke, PA-C  methocarbamol (ROBAXIN) 500 MG tablet Take 1 tablet (500 mg total) by mouth 2 (two) times daily. 08/07/23  Yes Alberta Lenhard, PA-C  albuterol (VENTOLIN HFA) 108 (90 Base) MCG/ACT inhaler Inhale 1-2 puffs into the lungs every 6 (six) hours as needed for wheezing or shortness of breath. 11/07/22   Small, Brooke L, PA  amoxicillin-clavulanate (AUGMENTIN) 875-125 MG tablet Take 1 tablet by mouth every 12 (twelve) hours. 04/13/22   Cecil Cobbs, PA-C  carbamide peroxide (DEBROX) 6.5 % OTIC solution Place 5 drops into the right ear 2 (two) times daily. 04/13/22   Cecil Cobbs, PA-C  cetirizine (ZYRTEC ALLERGY) 10 MG tablet Take 1 tablet (10 mg total) by mouth daily for 14 days. 04/13/22 04/27/22  Cecil Cobbs, PA-C  fluPHENAZine (PROLIXIN) 2.5 MG tablet TK 1 T PO QAM AND 2 TS QHS. DO NOT DRIVE OR OPERATE MACHINERY. DO NOT USE ALCOHOL 02/08/18   [provider]  loperamide (IMODIUM) 2 MG capsule Take 1 capsule (2 mg  total) by mouth 4 (four) times daily as needed for diarrhea or loose stools. 01/12/22   Felicie Morn, NP      Allergies    Patient has no known allergies.    Review of Systems   Review of Systems  Constitutional:  Negative for chills and fever.  Respiratory:  Negative for shortness of breath.   Cardiovascular:  Negative for chest pain.  Gastrointestinal:  Negative for abdominal pain.  Musculoskeletal:  Positive for arthralgias.  All other systems reviewed and are negative.   Physical Exam Updated Vital Signs BP 126/87   Pulse 93   Temp 97.8 F (36.6 C) (Oral)   Resp 18   Wt 99.8 kg   SpO2 94%   BMI 38.97 kg/m  Physical Exam Vitals and nursing note reviewed.  Constitutional:      General: He is not in acute distress.    Appearance: Normal appearance. He is not ill-appearing.  HENT:     Head: Normocephalic and atraumatic.     Nose: Nose normal.  Eyes:     Conjunctiva/sclera: Conjunctivae normal.  Cardiovascular:     Rate and Rhythm: Normal rate and regular rhythm.     Heart sounds: Normal heart sounds.  Pulmonary:     Effort: Pulmonary effort is normal. No respiratory distress.     Breath sounds: Normal breath sounds.  Musculoskeletal:  General: No deformity. Normal range of motion.     Cervical back: Normal range of motion.  Skin:    Findings: No rash.  Neurological:     Mental Status: He is alert.     ED Results / Procedures / Treatments   Labs (all labs ordered are listed, but only abnormal results are displayed) Labs Reviewed - No data to display  EKG None  Radiology No results found.  Procedures Procedures    Medications Ordered in ED Medications  ketorolac (TORADOL) 15 MG/ML injection 15 mg (has no administration in time range)  lidocaine (LIDODERM) 5 % 1 patch (has no administration in time range)    ED Course/ Medical Decision Making/ A&P                                 Medical Decision Making Risk Prescription drug  management.   37 year old male presents today for concern of left shoulder pain which started this morning.  Concern for muscle spasm.  Will give Toradol and lidocaine patch in the emergency department.  Will prescribe Robaxin, Lodine.  Supportive measures discussed.  No suspicion for bony injury as he has not had an injury.  Likely muscle strain/spasm given the new job, and he also reports awkward sleeping position last night.  Patient discharged in appropriate condition.  Precautions discussed.  Final Clinical Impression(s) / ED Diagnoses Final diagnoses:  Muscle spasm    Rx / DC Orders ED Discharge Orders          Ordered    etodolac (LODINE) 400 MG tablet  2 times daily        08/07/23 1132    methocarbamol (ROBAXIN) 500 MG tablet  2 times daily        08/07/23 1132    lidocaine (LIDODERM) 5 %  Every 24 hours        08/07/23 1132              Marita Kansas, PA-C 08/07/23 1144    Melene Plan, DO 08/07/23 1151

## 2023-08-07 NOTE — ED Triage Notes (Signed)
Pt reports woke up this  morning with pain left shoulder (scapula area). No known injuries. Hurts worse with movement
